# Patient Record
Sex: Male | Born: 1950
Health system: Southern US, Community
[De-identification: ages and names within clinical notes are randomized; demographics above are authoritative.]

## PROBLEM LIST (undated history)

## (undated) DIAGNOSIS — M1990 Unspecified osteoarthritis, unspecified site: Secondary | ICD-10-CM

## (undated) DIAGNOSIS — Z973 Presence of spectacles and contact lenses: Secondary | ICD-10-CM

## (undated) DIAGNOSIS — I10 Essential (primary) hypertension: Secondary | ICD-10-CM

## (undated) DIAGNOSIS — G40109 Localization-related (focal) (partial) symptomatic epilepsy and epileptic syndromes with simple partial seizures, not intractable, without status epilepticus: Secondary | ICD-10-CM

## (undated) DIAGNOSIS — M5126 Other intervertebral disc displacement, lumbar region: Secondary | ICD-10-CM

## (undated) DIAGNOSIS — H532 Diplopia: Secondary | ICD-10-CM

## (undated) DIAGNOSIS — Z889 Allergy status to unspecified drugs, medicaments and biological substances status: Secondary | ICD-10-CM

## (undated) DIAGNOSIS — F988 Other specified behavioral and emotional disorders with onset usually occurring in childhood and adolescence: Secondary | ICD-10-CM

## (undated) DIAGNOSIS — K219 Gastro-esophageal reflux disease without esophagitis: Secondary | ICD-10-CM

## (undated) DIAGNOSIS — J189 Pneumonia, unspecified organism: Secondary | ICD-10-CM

## (undated) DIAGNOSIS — L508 Other urticaria: Secondary | ICD-10-CM

## (undated) DIAGNOSIS — C801 Malignant (primary) neoplasm, unspecified: Secondary | ICD-10-CM

## (undated) DIAGNOSIS — Z760 Encounter for issue of repeat prescription: Secondary | ICD-10-CM

## (undated) DIAGNOSIS — R972 Elevated prostate specific antigen [PSA]: Secondary | ICD-10-CM

## (undated) DIAGNOSIS — G43909 Migraine, unspecified, not intractable, without status migrainosus: Secondary | ICD-10-CM

## (undated) HISTORY — DX: Essential (primary) hypertension: I10

## (undated) HISTORY — DX: Encounter for issue of repeat prescription: Z76.0

## (undated) HISTORY — PX: WISDOM TOOTH EXTRACTION: SHX21

## (undated) HISTORY — PX: PROSTATE BIOPSY: SHX241

## (undated) HISTORY — PX: COLONOSCOPY: SHX174

## (undated) HISTORY — PX: BACK SURGERY: SHX140

## (undated) HISTORY — PX: NASAL SINUS SURGERY: SHX719

## (undated) HISTORY — DX: Other urticaria: L50.8

## (undated) HISTORY — PX: TONSILLECTOMY: SUR1361

## (undated) HISTORY — DX: Migraine, unspecified, not intractable, without status migrainosus: G43.909

## (undated) HISTORY — DX: Diplopia: H53.2

---

## 2011-12-24 ENCOUNTER — Other Ambulatory Visit: Payer: Self-pay | Admitting: Family Medicine

## 2011-12-24 DIAGNOSIS — E039 Hypothyroidism, unspecified: Secondary | ICD-10-CM

## 2011-12-27 ENCOUNTER — Ambulatory Visit
Admission: RE | Admit: 2011-12-27 | Discharge: 2011-12-27 | Disposition: A | Discharge status: Home / Self Care | Payer: BC Managed Care – PPO | Source: Ambulatory Visit | Attending: Family Medicine | Admitting: Family Medicine

## 2011-12-27 DIAGNOSIS — E039 Hypothyroidism, unspecified: Secondary | ICD-10-CM

## 2011-12-28 ENCOUNTER — Other Ambulatory Visit: Payer: Self-pay

## 2012-09-08 ENCOUNTER — Telehealth: Payer: Self-pay | Admitting: Neurology

## 2012-09-08 NOTE — Telephone Encounter (Signed)
Spoke with pt and scheduled an appt on 01-13-13 at 3:30 PM with Dr Hosie Poisson.

## 2012-12-12 ENCOUNTER — Encounter: Payer: Self-pay | Admitting: Neurology

## 2012-12-12 ENCOUNTER — Ambulatory Visit (INDEPENDENT_AMBULATORY_CARE_PROVIDER_SITE_OTHER): Payer: Self-pay | Admitting: Neurology

## 2012-12-12 VITALS — BP 123/81 | HR 71 | Ht 68.0 in | Wt 163.0 lb

## 2012-12-12 DIAGNOSIS — G40909 Epilepsy, unspecified, not intractable, without status epilepticus: Secondary | ICD-10-CM | POA: Insufficient documentation

## 2012-12-12 DIAGNOSIS — R569 Unspecified convulsions: Secondary | ICD-10-CM

## 2012-12-12 NOTE — Patient Instructions (Signed)
Overall you are doing fairly well but I do want to suggest a few things today:   Remember to drink plenty of fluid, eat healthy meals and do not skip any meals. Try to eat protein with a every meal and eat a healthy snack such as fruit or nuts in between meals. Try to keep a regular sleep-wake schedule and try to exercise daily, particularly in the form of walking, 20-30 minutes a day, if you can.   As far as your medications are concerned, we will continue you on tegretol 200mg  bid.   We will follow up with Dr Clarene Duke in regards to your lab work.   I would like to see you back in 12 months, sooner if we need to. Please call us with any interim questions, concerns, problems, updates or refill requests.   Please also call us for any test results so we can go over those with you on the phone.  My clinical assistant and will answer any of your questions and relay your messages to me and also relay most of my messages to you.   Our phone number is [phone]. We also have an after hours call service for urgent matters and there is a physician on-call for urgent questions. For any emergencies you know to call 911 or go to the nearest emergency room

## 2012-12-12 NOTE — Progress Notes (Addendum)
Provider:  Dr Hosie Poisson Referring Provider: No ref. provider found Primary Care Physician:  No primary provider on file.  CC: seizures  HPI:  Ethan Santana is a 62 y.o. male here as a follow up visit for his seizure disorder  No acute change since last visit. Continues to take Tegretol 200 mg twice a day. He recently had lab work including Tegretol level and sodium done by his primary care physician. He reports everything is normal, though he doesn't have copies of the lab reports. Denies any seizure activity in over 25 years. He notes his seizures consisted of hearing music. Tolerating Tegretol well, no gait ataxia of visual issues no dizziness no vertigo. Reports his sodium levels have been normal on all checks. Reports his last bone density scan was around 2-3 years ago was normal. Continues to take calcium and vitamin D.  Otherwise no acute issues, reports health is good. Remains active. Is currently driving.   Per Dr Ethan Santana prior note from 02/2012: 62y/o gentleman with history of seizures since childhood. He notes a strange sound or music going through his head, also have a sensation of something smooth. Lasted anywhere from few minutes to . Evaluated at [facility], had abnormal EEG, started on Dilantin, symptoms resolved after 3 years. Repeat EEG normal and they took off Dilantin   Review of Systems:  Out of a complete 14 system review, the patient complains of only the following symptoms, and all other reviewed systems are negative. Positive for remote seizure history and itching  History   Social History  . Marital Status: Married    Spouse Name: N/A    Number of Children: N/A  . Years of Education: N/A   Occupational History  . Not on file.   Social History Main Topics  . Smoking status: Not on file  . Smokeless tobacco: Not on file  . Alcohol Use: Not on file  . Drug Use: Not on file  . Sexually Active: Not on file   Other Topics Concern  . Not on file   Social  History Narrative  . No narrative on file    No family history on file.  No past medical history on file.  No past surgical history on file.  No current outpatient prescriptions on file.   No current facility-administered medications for this visit.    Allergies as of 12/12/2012  . (Not on File)    Vitals: There were no vitals taken for this visit. Last Weight:  Wt Readings from Last 1 Encounters:  No data found for Wt   Last Height:   Ht Readings from Last 1 Encounters:  No data found for Ht     Physical exam: Exam: Gen: NAD, conversant Eyes: anicteric sclerae, moist conjunctivae HENT: Atraumati Lungs: CTA, no wheezing, rales, rhonic                          CV: RRR, no MRG Abdomen: Soft, non-tender;  Extremities: No peripheral edema  Skin: Normal temperature, no rash,  Psych: Appropriate affect, pleasant  Neuro: MS: AA&Ox3, appropriately interactive, normal affect   Speech: fluent w/o paraphasic error  Memory: good recent and remote recall  CN: PERRL, EOMI few beats horizontal nystagmus bilaterally, no ptosis, sensation intact to LT V1-V3 bilat, face symmetric, no weakness, hearing grossly intact, palate elevates symmetrically, shoulder shrug 5/5 bilat,  tongue protrudes midline, no fasiculations noted.  Motor: normal bulk and tone Strength: 5/5  In all extremities  Coord: rapid alternating and point-to-point (FNF, HTS) movements intact.  Reflexes: symmetrical, bilat downgoing toes  Sens: LT intact in all extremities  Gait: posture, stance, stride and arm-swing normal. Able to tandem though with mild difficulty.    Assessment:  After physical and neurologic examination, review of laboratory studies, imaging, neurophysiology testing and pre-existing records, assessment will be reviewed on the problem list.  Plan:  Treatment plan and additional workup will be reviewed under Problem List.  Mr. Ethan Santana is a pleasant 62 year old gentleman with a  history of seizure disorder which is currently well controlled on a regimen of Tegretol 200 mg twice a day. He reports his last seizure was over 25 years ago. He has been tried off of medication and does have her breakthrough seizures. Physical exam is clinically stable.  1) seizure disorder -Continue on Tegretol 200 mg twice a day. -Will followup with primary care physician for lab work -Will plan for bone density scan repeated at next visit. Patient to continue on calcium and vitamin D supplement -Patient to followup in one year  Total of 30 minutes was spent with this patient. Over half the time was spent in direct face-to-face consultation. We discussed patient's concerns about his medication, such for breakthrough seizures and the necessity for a bone density scan. All questions were fully answered.   I have read the note, and I agree with the clinical assessment and plan.  Ethan Santana

## 2013-01-13 ENCOUNTER — Ambulatory Visit: Payer: Self-pay | Admitting: Neurology

## 2013-02-04 ENCOUNTER — Other Ambulatory Visit: Payer: Self-pay

## 2013-02-04 MED ORDER — CARBAMAZEPINE 200 MG PO TABS: 200.0000 mg | ORAL_TABLET | Freq: Two times a day (BID) | ORAL | Status: DC

## 2013-12-11 ENCOUNTER — Ambulatory Visit: Payer: Self-pay | Admitting: Neurology

## 2013-12-29 ENCOUNTER — Other Ambulatory Visit: Payer: Self-pay

## 2013-12-29 MED ORDER — CARBAMAZEPINE 200 MG PO TABS: 200.0000 mg | ORAL_TABLET | Freq: Two times a day (BID) | ORAL | Status: DC

## 2014-01-06 ENCOUNTER — Ambulatory Visit (INDEPENDENT_AMBULATORY_CARE_PROVIDER_SITE_OTHER): Payer: BC Managed Care – PPO | Admitting: Neurology

## 2014-01-06 ENCOUNTER — Encounter: Payer: Self-pay | Admitting: Neurology

## 2014-01-06 VITALS — BP 132/84 | HR 70 | Ht 68.0 in | Wt 160.0 lb

## 2014-01-06 DIAGNOSIS — R569 Unspecified convulsions: Secondary | ICD-10-CM

## 2014-01-06 MED ORDER — CARBAMAZEPINE 200 MG PO TABS: ORAL_TABLET | ORAL | Status: DC

## 2014-01-06 NOTE — Patient Instructions (Signed)
Overall you are doing fairly well but I do want to suggest a few things today:   Remember to drink plenty of fluid, eat healthy meals and do not skip any meals. Try to eat protein with a every meal and eat a healthy snack such as fruit or nuts in between meals. Try to keep a regular sleep-wake schedule and try to exercise daily, particularly in the form of walking, 20-30 minutes a day, if you can.   As far as your medications are concerned, I would like to suggest you change your tegretol to 1/2 tablet in the morning and 1 whole tablet in the evening  As far as diagnostic testing:  1)Please have some blood work checked when you follow up with Dr Rex Kras  Please follow up in 12 months with Dr Brett Fairy. Please call us with any interim questions, concerns, problems, updates or refill requests.   My clinical assistant and will answer any of your questions and relay your messages to me and also relay most of my messages to you.   Our phone number is [phone]. We also have an after hours call service for urgent matters and there is a physician on-call for urgent questions. For any emergencies you know to call 911 or go to the nearest emergency room

## 2014-01-06 NOTE — Progress Notes (Signed)
Provider:  Dr Ethan Santana Referring Provider: Hulan Fess, MD Primary Care Physician:  Ethan Pac, MD  CC: seizures  HPI:  Ethan Santana is a 64 y.o. male here as a follow up visit for his seizure disorder  No acute change since last visit on 12/2012. Continues to take Tegretol 200 mg twice a day. He notes overall he is tolerating well, notes some recent intermittent episodes of double vision. Notes this is only when he is looking upward. Comes and goes, can be exacerbated by cold weather. No seizure activity, no aura or sensory/motor changes. Takes vitamin D on a regular basis. He is scheduled to follow up with his PCP next month for routine blood work. Notes some worsening of his actinic purpura, he is followed by dermatology for this.   Overall reports he is in good health. Remains active, walks on a regular basis.   Per Dr Ethan Santana prior note from 02/2012: 63y/o gentleman with history of seizures since childhood. He notes a strange sound or music going through his head, also have a sensation of something smooth. Lasted anywhere from few minutes to 1minutes. Evaluated at [facility], had abnormal EEG, started on Dilantin, symptoms resolved after 3 years. Repeat EEG normal and they took off Dilantin   Review of Systems:  Out of a complete 14 system review, the patient complains of only the following symptoms, and all other reviewed systems are negative. Positive for remote seizure history and itching  History   Social History  . Marital Status: Married    Spouse Name: Ethan Santana     Number of Children: 2  . Years of Education: 12+   Occupational History  . Self empolyed     Social History Main Topics  . Smoking status: Never Smoker   . Smokeless tobacco: Never Used  . Alcohol Use: No  . Drug Use: No  . Sexual Activity: Not on file   Other Topics Concern  . Not on file   Social History Narrative   Patient lives at home wife Ethan Santana    Have have 2 children.    Patient is right  handed.    Patient has his masters   Patient works at home.     Family History  Problem Relation Age of Onset  . Stroke Mother   . Bone cancer Father   . Cancer Mother     Past Medical History  Diagnosis Date  . HBP (high blood pressure)   . Seizure   . Migraine   . Chronic urticaria     Past Surgical History  Procedure Laterality Date  . Tonsillectomy      Current Outpatient Prescriptions  Medication Sig Dispense Refill  . amphetamine-dextroamphetamine (ADDERALL) 20 MG tablet       . Calcium Carb-Cholecalciferol (CALCIUM 1000 + D PO) Take 1,000 mg by mouth daily.       . carbamazepine (TEGRETOL) 200 MG tablet Take 1 tablet (200 mg total) by mouth 2 (two) times daily.  60 tablet  0  . Cholecalciferol (VITAMIN D) 2000 UNITS CAPS Take 2,000 Units by mouth daily.      Marland Kitchen CLOBETASOL PROP CREA-COAL TAR EX Apply topically.      . famotidine (PEPCID) 20 MG tablet Take 20 mg by mouth daily.      . fluticasone (FLONASE) 50 MCG/ACT nasal spray       . Loratadine (CLARITIN) 10 MG CAPS Take 10 mg by mouth 3 (three) times daily.       Marland Kitchen  nadolol (CORGARD) 20 MG tablet Take by mouth daily.      Marland Kitchen OMEPRAZOLE PO Take by mouth.      . psyllium (REGULOID) 0.52 G capsule Take 0.52 g by mouth 4 (four) times daily.      Marland Kitchen VIAGRA 100 MG tablet        No current facility-administered medications for this visit.    Allergies as of 01/06/2014  . (No Known Allergies)    Vitals: BP 132/84  Pulse 70  Ht 5\' 8"  (1.727 m)  Wt 160 lb (72.576 kg)  BMI 24.33 kg/m2 Last Weight:  Wt Readings from Last 1 Encounters:  01/06/14 160 lb (72.576 kg)   Last Height:   Ht Readings from Last 1 Encounters:  01/06/14 5\' 8"  (1.727 m)     Physical exam: Exam: Gen: NAD, conversant Eyes: anicteric sclerae, moist conjunctivae HENT: Atraumati Lungs: CTA, no wheezing, rales, rhonic                          CV: RRR, no MRG Abdomen: Soft, non-tender;  Extremities: No peripheral edema  Skin: Normal  temperature, no rash,  Psych: Appropriate affect, pleasant  Neuro: MS: AA&Ox3, appropriately interactive, normal affect   Speech: fluent w/o paraphasic error  Memory: good recent and remote recall  CN: PERRL, EOMI few beats horizontal nystagmus bilaterally, medial deviation of OS with up gaze, no ptosis, sensation intact to LT V1-V3 bilat, face symmetric, no weakness, hearing grossly intact, palate elevates symmetrically, shoulder shrug 5/5 bilat,  tongue protrudes midline, no fasiculations noted.  Motor: normal bulk and tone Strength: 5/5  In all extremities  Coord: rapid alternating and point-to-point (FNF, HTS) movements intact.  Reflexes: symmetrical, bilat downgoing toes  Sens: LT intact in all extremities  Gait: posture, stance, stride and arm-swing normal. Able to tandem though with mild difficulty.    Assessment:  After physical and neurologic examination, review of laboratory studies, imaging, neurophysiology testing and pre-existing records, assessment will be reviewed on the problem list.  Plan:  Treatment plan and additional workup will be reviewed under Problem List.  Ethan Santana is a pleasant 63 year old gentleman with a history of seizure disorder which is currently well controlled on a regimen of Tegretol 200 mg twice a day. He reports his last seizure was over 25 years ago. He does note some mild blurry vision and diplopia with up gaze. Question if this can be related to tegretol usage. He will have level checked with PCP next month. Will try to taper down dosage to 100mg  in the morning and 200mg  in the evening to see if any improvement. Continue on daily calcium and vitamin D.  He has been tried off of medication and does have her breakthrough seizures. Physical exam is clinically stable.  1) seizure disorder -Continue on Tegretol with dose change to 100mg  in the morning and 200mg  in the evening. -Will followup with primary care physician for lab work -Patient to  continue on calcium and vitamin D supplement -Patient to followup in one year

## 2015-01-11 ENCOUNTER — Encounter: Payer: Self-pay | Admitting: Neurology

## 2015-01-11 ENCOUNTER — Ambulatory Visit (INDEPENDENT_AMBULATORY_CARE_PROVIDER_SITE_OTHER): Payer: 59 | Admitting: Neurology

## 2015-01-11 VITALS — BP 136/89 | HR 66 | Resp 16 | Ht 68.0 in | Wt 162.5 lb

## 2015-01-11 DIAGNOSIS — H532 Diplopia: Secondary | ICD-10-CM | POA: Diagnosis not present

## 2015-01-11 DIAGNOSIS — G4089 Other seizures: Secondary | ICD-10-CM

## 2015-01-11 DIAGNOSIS — G40909 Epilepsy, unspecified, not intractable, without status epilepticus: Secondary | ICD-10-CM

## 2015-01-11 DIAGNOSIS — Z760 Encounter for issue of repeat prescription: Secondary | ICD-10-CM

## 2015-01-11 DIAGNOSIS — R0683 Snoring: Secondary | ICD-10-CM | POA: Diagnosis not present

## 2015-01-11 HISTORY — DX: Encounter for issue of repeat prescription: Z76.0

## 2015-01-11 HISTORY — DX: Diplopia: H53.2

## 2015-01-11 MED ORDER — CARBAMAZEPINE 200 MG PO TABS: ORAL_TABLET | ORAL | Status: DC

## 2015-01-11 NOTE — Progress Notes (Signed)
[facility]   Provider:  Larey Seat, M D  Referring Provider: Hulan Fess, MD Primary Care Physician:  Gennette Pac, MD  Chief Complaint  Patient presents with  . Follow-up    sz    HPI:  Ethan Santana is a 64 y.o. male , seen here as a referral/ revisit  from Ethan Santana for a seizure disorder, well controlled on Tegretol.   Last seizure over 2 decades ago. Treated by Ethan Santana until 2014,  he was followed by Ethan Santana last year and now assigned to me. Ethan Santana reports that even during his college years he was well aware of spells and finally suffered a seizure. He was initially on Dilantin, weaning the medication caused seizures to resurface. He could describe sensory abnormalities. He has experienced " sense of doom " , and no seizure activity visible on the outside, no tonic clonic activity. Until 2 years ago,  he was on Tegretol by that time, he would begin to have occasional diplopia.  An EEG confirmed his seizure diagnosis, after the first one was "normal ".   The patient has no restrictions on activities of daily living, operation of machinery's, driving, [place] in altitudes etc. Again he has never suffered a tonic-clonic seizure.  Social history: The patient is married with grown children, is an e book Chief Strategy Officer, Financial risk analyst.  The patient has 2 master's degree one in Careers information officer and one in education - pedagogic.  Review of Systems: Out of a complete 14 system review, the patient complains of only the following symptoms, and all other reviewed systems are negative.   depression score 2 Snoring, not apnea/  Social History   Social History  . Marital Status: Married    Spouse Name: [name]   . Number of Children: 2  . Years of Education: 12+   Occupational History  . Self empolyed     Social History Main Topics  . Smoking status: Never Smoker   . Smokeless tobacco: Never Used  . Alcohol Use: No  . Drug Use: No  .  Sexual Activity: Not on file   Other Topics Concern  . Not on file   Social History Narrative   Patient lives at home wife [name]    Have have 2 children.    Patient is right handed.    Patient has his masters   Patient works at home.     Family History  Problem Relation Age of Onset  . Stroke Mother   . Bone cancer Father   . Cancer Mother     Past Medical History  Diagnosis Date  . HBP (high blood pressure)   . Seizure   . Migraine   . Chronic urticaria   . Refill clinic medication management patient 01/11/2015  . Diplopia 01/11/2015    Past Surgical History  Procedure Laterality Date  . Tonsillectomy      Current Outpatient Prescriptions  Medication Sig Dispense Refill  . amphetamine-dextroamphetamine (ADDERALL) 20 MG tablet     . Calcium Carb-Cholecalciferol (CALCIUM 1000 + D PO) Take 1,000 mg by mouth daily.     . carbamazepine (TEGRETOL) 200 MG tablet Take 1/2 tablet in the morning and 1 whole tablet in the evening 180 tablet 3  . Cholecalciferol (VITAMIN D) 2000 UNITS CAPS Take 2,000 Units by mouth daily.    Marland Kitchen CLOBETASOL PROP CREA-COAL TAR EX Apply topically.    . famotidine (PEPCID) 20 MG tablet Take 20 mg by mouth  daily.    . fluticasone (FLONASE) 50 MCG/ACT nasal spray     . Loratadine (CLARITIN) 10 MG CAPS Take 10 mg by mouth 3 (three) times daily.     . nadolol (CORGARD) 20 MG tablet Take by mouth daily.    Marland Kitchen OMEPRAZOLE PO Take by mouth.    . psyllium (REGULOID) 0.52 G capsule Take 0.52 g by mouth 4 (four) times daily.    Marland Kitchen VIAGRA 100 MG tablet      No current facility-administered medications for this visit.    Allergies as of 01/11/2015  . (No Known Allergies)    Vitals: BP 136/89 mmHg  Pulse 66  Resp 16  Ht 5\' 8"  (1.727 m)  Wt 162 lb 8 oz (73.71 kg)  BMI 24.71 kg/m2 Last Weight:  Wt Readings from Last 1 Encounters:  01/11/15 162 lb 8 oz (73.71 kg)   VPC:HEKB mass index is 24.71 kg/(m^2).     Last Height:   Ht Readings from Last 1  Encounters:  01/11/15 5\' 8"  (1.727 m)    Physical exam:  General: The patient is awake, alert and appears not in acute distress. The patient is well groomed. Head: Normocephalic, atraumatic. Neck is supple. Mallampati3,  neck circumference:15. Nasal airflow unrestricted , Cardiovascular:  Regular rate and rhythm , without  murmurs or carotid bruit, and without distended neck veins. Respiratory: Lungs are clear to auscultation. Skin:  Without evidence of edema, or rash Trunk:  The patient's posture is erect.  Neurologic exam : The patient is awake and alert, oriented to place and time.   Memory subjective  described as intact.  Attention span & concentration ability appears normal.  Speech is fluent,  Without  dysarthria, dysphonia or aphasia.  Mood and affect are appropriate.  Cranial nerves: Pupils are equal and briskly reactive to light.   Extraocular movements  in vertical and horizontal planes intact and without nystagmus. Visual fields by finger perimetry are intact. Ethan Santana reports skewed diplopia, worse when he is fatigued and worse when the weather is cold. Worse when looking up.  Hearing to finger rub intact.   Facial sensation intact to fine touch.  Facial motor strength is symmetric and tongue and uvula move midline. Shoulder shrug was symmetrical.   Motor exam:  Normal tone, muscle bulk and symmetric strength in all extremities.  Sensory:  Fine touch, pinprick and vibration were tested in all extremities. Proprioception tested in the upper extremities was normal.  Coordination: Rapid alternating movements in the fingers/hands was normal.  Finger-to-nose maneuver normal without evidence of ataxia, dysmetria or tremor.  Gait and station: Patient walks without assistive device and is able unassisted to climb up to the exam table. Strength within normal limits.  Stance is stable and normal.  Toe and hell stand were tested .Tandem gait is unfragmented. Turns with 3   Steps. Romberg testing is negative.  Deep tendon reflexes: in the  upper and lower extremities are symmetric and intact. Plantar reflex downgoing.  The patient was advised of the nature of the diagnosed sleep disorder,  the treatment options and risks for general a health and wellness arising from not treating the condition.  I spent more than 25 minutes of face to face time with the patient.   Greater than 50% of time was spent in counseling and coordination of care. We have discussed the diagnosis and differential and I answered the patient's questions.    Ethan Santana saw the patient on 10-10 -2013 at the  time of 64 year old gentleman with history of seizures since childhood. He will note a strange sound or music going through his had and sometimes have a sensation of being touched by some things will. Can last anywhere from a few minutes to 30 minutes. Had abnormal EEGs in the past, started on Dilantin symptoms resolved after 3 years after weaning off Dilantin the patient had the same activity. Repeat EEGs were normal but the patient still experienced the same spells he was changed to Tegretol which she has been well controlled ever since. Assessment:  After physical and neurologic examination, review of laboratory studies,  Personal review of imaging studies, reports of other /same  Imaging studies, results of polysomnography/ neurophysiology testing and pre-existing records as far as provided in visit., my assessment is   1)  well-controlled seizure disorder currently on 200 mg of Tegretol in the evening 100 mg in the morning.   Encouraged continuation of calcium and vitamin D supplements.   Encouraged hydration. Primary care physician usually provides labwork once a year.  He has to be followed for hyponatremia and leukopenia in relation to long-term Tegretol use.    Asencion Partridge Ines Warf MD  01/11/2015   CC: Hulan Fess, [address] [place], [place] [address]

## 2015-01-11 NOTE — Patient Instructions (Signed)
Carbamazepine tablets What is this medicine? CARBAMAZEPINE (kar ba MAZ e peen) is used to control seizures caused by certain types of epilepsy. This medicine is also used to treat nerve related pain. It is not for common aches and pains. This medicine may be used for other purposes; ask your health care provider or pharmacist if you have questions. COMMON BRAND NAME(S): Epitol, Tegretol What should I tell my health care provider before I take this medicine? They need to know if you have any of these conditions: -Asian ancestry -bone marrow disease -glaucoma -heart disease or irregular heartbeat -kidney disease -liver disease -low blood counts, like low white cell, platelet, or red cell counts -porphyria -psychotic disorders -suicidal thoughts, plans, or attempt; a previous suicide attempt by you or a family member -an unusual or allergic reaction to carbamazepine, tricyclic antidepressants, phenytoin, phenobarbital or other medicines, foods, dyes, or preservatives -pregnant or trying to get pregnant -breast-feeding How should I use this medicine? Take this medicine by mouth with a glass of water. Follow the directions on the prescription label. Take this medicine with food. Take your doses at regular intervals. Do not take your medicine more often than directed. Do not stop taking this medicine except on the advice of your doctor or health care professional. A special MedGuide will be given to you by the pharmacist with each prescription and refill. Be sure to read this information carefully each time. Talk to your pediatrician regarding the use of this medicine in children. Special care may be needed. Overdosage: If you think you have taken too much of this medicine contact a poison control center or emergency room at once. NOTE: This medicine is only for you. Do not share this medicine with others. What if I miss a dose? If you miss a dose, take it as soon as you can. If it is almost  time for your next dose, take only that dose. Do not take double or extra doses. What may interact with this medicine? Do not take this medicine with any of the following medications: -delavirdine -MAOIs like Carbex, Eldepryl, Marplan, Nardil, and Parnate -nefazodone -oxcarbazepine This medicine may also interact with the following medications: -acetaminophen -acetazolamide -barbiturate medicines for inducing sleep or treating seizures, like phenobarbital -certain antibiotics like clarithromycin, erythromycin or troleandomycin -cimetidine -cyclosporine -danazol -dicumarol -doxycycline -male hormones, including estrogens and birth control pills -grapefruit juice -isoniazid, INH -levothyroxine and other thyroid hormones -lithium and other medicines to treat mood problems or psychotic disturbances -loratadine -medicines for angina or high blood pressure -medicines for cancer -medicines for depression or anxiety -medicines for sleep -medicines to treat fungal infections, like fluconazole, itraconazole or ketoconazole -medicines used to treat HIV infection or AIDS -methadone -niacinamide -praziquantel -propoxyphene -rifampin or rifabutin -seizure or epilepsy medicine -steroid medicines such as prednisone or cortisone -theophylline -tramadol -warfarin This list may not describe all possible interactions. Give your health care provider a list of all the medicines, herbs, non-prescription drugs, or dietary supplements you use. Also tell them if you smoke, drink alcohol, or use illegal drugs. Some items may interact with your medicine. What should I watch for while using this medicine? Visit your doctor or health care professional for a regular check on your progress. Do not change brands or dosage forms of this medicine without discussing the change with your doctor or health care professional. If you are taking this medicine for epilepsy (seizures) do not stop taking it suddenly.  This increases the risk of seizures. Wear a Probation officer  or necklace. Carry an identification card with information about your condition, medications, and doctor or health care professional. Dennis Bast may get drowsy, dizzy, or have blurred vision. Do not drive, use machinery, or do anything that needs mental alertness until you know how this medicine affects you. To reduce dizzy or fainting spells, do not sit or stand up quickly, especially if you are an older patient. Alcohol can increase drowsiness and dizziness. Avoid alcoholic drinks. Birth control pills may not work properly while you are taking this medicine. Talk to your doctor about using an extra method of birth control. This medicine can make you more sensitive to the sun. Keep out of the sun. If you cannot avoid being in the sun, wear protective clothing and use sunscreen. Do not use sun lamps or tanning beds/booths. The use of this medicine may increase the chance of suicidal thoughts or actions. Pay special attention to how you are responding while on this medicine. Any worsening of mood, or thoughts of suicide or dying should be reported to your health care professional right away. Women who become pregnant while using this medicine may enroll in the [place] Pregnancy Registry by calling [phone]. This registry collects information about the safety of antiepileptic drug use during pregnancy. What side effects may I notice from receiving this medicine? Side effects that you should report to your doctor or health care professional as soon as possible: -allergic reactions like skin rash, itching or hives, swelling of the face, lips, or tongue -breathing problems -changes in vision -confusion -dark urine -fast or irregular heartbeat -fever or chills, sore throat -mouth ulcers -pain or difficulty passing urine -redness, blistering, peeling or loosening of the skin, including inside the mouth -ringing in  the ears -seizures -stomach pain -swollen joints or muscle/joint aches and pains -unusual bleeding or bruising -unusually weak or tired -vomiting -worsening of mood, thoughts or actions of suicide or dying -yellowing of the eyes or skin Side effects that usually do not require medical attention (report to your doctor or health care professional if they continue or are bothersome): -clumsiness or unsteadiness -diarrhea or constipation -headache -increased sweating -nausea This list may not describe all possible side effects. Call your doctor for medical advice about side effects. You may report side effects to FDA at 1-800-FDA-1088. Where should I keep my medicine? Keep out of reach of children. Store at room temperature below 30 degrees C (86 degrees F). Keep container tightly closed. Protect from moisture. Throw away any unused medicine after the expiration date. NOTE: This sheet is a summary. It may not cover all possible information. If you have questions about this medicine, talk to your doctor, pharmacist, or health care provider.  2015, Elsevier/Gold Standard. (2012-07-01 15:31:28)

## 2015-08-23 ENCOUNTER — Encounter: Payer: Self-pay | Admitting: Neurology

## 2016-01-12 ENCOUNTER — Ambulatory Visit: Payer: 59 | Admitting: Neurology

## 2016-02-06 ENCOUNTER — Other Ambulatory Visit: Payer: Self-pay | Admitting: Neurology

## 2016-02-06 DIAGNOSIS — R0683 Snoring: Secondary | ICD-10-CM

## 2016-02-06 DIAGNOSIS — Z760 Encounter for issue of repeat prescription: Secondary | ICD-10-CM

## 2016-02-06 DIAGNOSIS — G40909 Epilepsy, unspecified, not intractable, without status epilepticus: Secondary | ICD-10-CM

## 2016-02-06 DIAGNOSIS — H532 Diplopia: Secondary | ICD-10-CM

## 2016-02-07 ENCOUNTER — Ambulatory Visit (INDEPENDENT_AMBULATORY_CARE_PROVIDER_SITE_OTHER): Payer: BLUE CROSS/BLUE SHIELD | Admitting: Neurology

## 2016-02-07 ENCOUNTER — Encounter: Payer: Self-pay | Admitting: Neurology

## 2016-02-07 DIAGNOSIS — R0683 Snoring: Secondary | ICD-10-CM | POA: Insufficient documentation

## 2016-02-07 DIAGNOSIS — G40909 Epilepsy, unspecified, not intractable, without status epilepticus: Secondary | ICD-10-CM

## 2016-02-07 DIAGNOSIS — H532 Diplopia: Secondary | ICD-10-CM

## 2016-02-07 DIAGNOSIS — Z760 Encounter for issue of repeat prescription: Secondary | ICD-10-CM

## 2016-02-07 MED ORDER — CARBAMAZEPINE 200 MG PO TABS: ORAL_TABLET | ORAL | 11 refills | Status: DC

## 2016-02-07 NOTE — Progress Notes (Signed)
[facility]   Provider:  Larey Seat, M D  Referring Provider: Hulan Fess, MD Primary Care Physician:  Gennette Pac, MD  Chief Complaint  Patient presents with  . Follow-up    follow up seizures, no seizure activity    HPI:  Ethan Santana is a 65 y.o. male , seen here as a referral/ revisit  from Dr. Rex Kras for a seizure disorder, well controlled on Tegretol.   Last seizure over 2 decades ago. Treated by Dr. Erling Cruz until 2014,  he was followed by Dr. Janann Colonel last year and now assigned to me since 2016 . Ethan Santana reports that even during his college years he was well aware of spells and finally suffered a seizure. He was initially on Dilantin, weaning the medication caused seizures to resurface. He could describe sensory abnormalities. He has experienced " sense of doom " , and no seizure activity visible on the outside, no tonic clonic activity. Until 2 years ago,  he was on Tegretol by that time, he would begin to have occasional diplopia.  An EEG confirmed his seizure diagnosis, after the first one was "normal ".   The patient has no restrictions on activities of daily living, operation of machinery's, driving, being in higher altitudes etc. Again he has never suffered a tonic-clonic seizure. Social history: The patient is married with grown children, is an e book Chief Strategy Officer, Financial risk analyst. The patient has 2 master's degree one in Careers information officer and one in education - pedagogic in science .  Interval history from 02/07/2016, the pleasure of seeing Ethan Santana today in a yearly revisit. He has been doing very well his last seizure is over 23 years ago. He has maintained on the same medication with good success but with a slight side effect progression including balance impairment when in a dark room. His optic control his balance is fine. Has occasionally had to scoot diplopia which also could be attributable to Tegretol (especially if the  patient has a "lazy eye").  He is in good health and with good humour. This years DEXA scan was normal.    Review of Systems: Out of a complete 14 system review, the patient complains of only the following symptoms, and all other reviewed systems are negative.   Geriatric depression score;  2/ 15  Snoring, not apnea/ no insomnia.   Social History   Social History  . Marital status: Married    Spouse name: [name]   . Number of children: 2  . Years of education: 12+   Occupational History  . Self empolyed     Social History Main Topics  . Smoking status: Never Smoker  . Smokeless tobacco: Never Used  . Alcohol use No  . Drug use: No  . Sexual activity: Not on file   Other Topics Concern  . Not on file   Social History Narrative   Patient lives at home wife [name]    Have have 2 children.    Patient is right handed.    Patient has his masters   Patient works at home.     Family History  Problem Relation Age of Onset  . Stroke Mother   . Bone cancer Father   . Cancer Mother     Past Medical History:  Diagnosis Date  . Chronic urticaria   . Diplopia 01/11/2015  . HBP (high blood pressure)   . Migraine   . Refill clinic medication management patient 01/11/2015  .  Seizure [facility])     Past Surgical History:  Procedure Laterality Date  . TONSILLECTOMY      Current Outpatient Prescriptions  Medication Sig Dispense Refill  . amphetamine-dextroamphetamine (ADDERALL) 20 MG tablet     . Calcium Carb-Cholecalciferol (CALCIUM 1000 + D PO) Take 1,000 mg by mouth daily.     . carbamazepine (TEGRETOL) 200 MG tablet TAKE 1/2 TABLET IN THE MORNING AND 1 TABLET IN THE EVENING. 45 tablet 0  . Cholecalciferol (VITAMIN D) 2000 UNITS CAPS Take 2,000 Units by mouth daily.    Marland Kitchen CLOBETASOL PROP CREA-COAL TAR EX Apply topically.    . famotidine (PEPCID) 20 MG tablet Take 20 mg by mouth daily.    . fluticasone (FLONASE) 50 MCG/ACT nasal spray     . Loratadine (CLARITIN) 10 MG CAPS  Take 10 mg by mouth 3 (three) times daily.     . nadolol (CORGARD) 20 MG tablet Take by mouth daily.    Marland Kitchen OMEPRAZOLE PO Take by mouth.    . psyllium (REGULOID) 0.52 G capsule Take 0.52 g by mouth 4 (four) times daily.    Marland Kitchen VIAGRA 100 MG tablet      No current facility-administered medications for this visit.     Allergies as of 02/07/2016  . (No Known Allergies)    Vitals: BP 110/86   Pulse 76   Resp 20   Ht 5\' 8"  (1.727 m)   Wt 161 lb (73 kg)   BMI 24.48 kg/m  Last Weight:  Wt Readings from Last 1 Encounters:  02/07/16 161 lb (73 kg)   [id number] mass index is 24.48 kg/m.     Last Height:   Ht Readings from Last 1 Encounters:  02/07/16 5\' 8"  (1.727 m)    Physical exam:  General: The patient is awake, alert and appears not in acute distress. The patient is well groomed. Head: Normocephalic, atraumatic. Neck is supple. Cardiovascular:  Regular rate and rhythm , without  murmurs or carotid bruit, and without distended neck veins. Respiratory: Lungs are clear to auscultation. Skin:  Without evidence of edema, or rash Trunk: The patient's posture is erect.  Neurologic exam : The patient is awake and alert, oriented to place and time.   Memory subjective  described as intact.  Attention span & concentration ability appears normal.  Speech is fluent,  Without  dysarthria, dysphonia or aphasia.  Mood and affect are appropriate.  Cranial nerves: Pupils are equal and briskly reactive to light.   Extraocular movements  in vertical and horizontal planes intact and without nystagmus. Visual fields by finger perimetry are intact. Ethan Santana reports skewed diplopia, worse when he is fatigued and worse when the weather is cold. Worse when looking up.  Hearing to finger rub intact. Facial motor strength is symmetric and tongue and uvula move midline. Shoulder shrug was symmetrical.   Motor exam:  Normal tone, muscle bulk and symmetric strength in all extremities. Coordination:  Finger-to-nose maneuver normal without evidence of ataxia, dysmetria or tremor. No handwriting changes, no clumsiness.  Gait and station: Patient walks without assistive device and is able unassisted to climb up to the exam table. Strength within normal limits.Stance is stable and normal. Turns with 3  Steps. Heel to toe is unsteady  Deep tendon reflexes: in the upper and lower extremities are symmetric and intact. Plantar reflex downgoing.  The patient was advised of the nature of the diagnosed sleep disorder,  the treatment options and risks for general a health and  wellness arising from not treating the condition.  I spent more than 20 minutes of face to face time with the patient.   Greater than 50% of time was spent in counseling and coordination of care. We have discussed the diagnosis and differential and I answered the patient's questions.    Caucasian gentleman with history of seizures since childhood. He will note a strange sound or music going through his had and sometimes have a sensation" of being touched " preceding the event . Can last anywhere from a few minutes to 30 minutes.  Had abnormal EEGs in the past, started on Dilantin symptoms resolved after 3 years after weaning off Dilantin the patient had the same activity. Repeat EEGs were normal but the patient still experienced the same spells he was changed to Tegretol, which she has been well controlled ever since. Ethan Santana has noted a decline in balance/ coordination capacity to some mild degree progressive, affecting him in a dark room. This may be attributable to Tegretol.   Assessment:  After physical and neurologic examination, review of laboratory studies,  Personal review of imaging studies, reports of other /same  Imaging studies, results of polysomnography/ neurophysiology testing and pre-existing records as far as provided in visit., my assessment is   1)  well-controlled seizure disorder currently on 200 mg of Tegretol  in the evening 100 mg in the morning.   Encouraged continuation of calcium and vitamin D supplements.   Encouraged hydration. Primary care physician usually provides labwork once a year.  He has to be followed for hyponatremia and leukopenia in relation to long-term Tegretol use.     Asencion Partridge Devona Holmes MD  02/07/2016   CC: Hulan Fess, [place] [address] [place], [place] [id number]

## 2016-02-07 NOTE — Patient Instructions (Signed)
Carbamazepine tablets What is this medicine? CARBAMAZEPINE (kar ba MAZ e peen) is used to control seizures caused by certain types of epilepsy. This medicine is also used to treat nerve related pain. It is not for common aches and pains. This medicine may be used for other purposes; ask your health care provider or pharmacist if you have questions. What should I tell my health care provider before I take this medicine? They need to know if you have any of these conditions: -Asian ancestry -bone marrow disease -glaucoma -heart disease or irregular heartbeat -kidney disease -liver disease -low blood counts, like low white cell, platelet, or red cell counts -porphyria -psychotic disorders -suicidal thoughts, plans, or attempt; a previous suicide attempt by you or a family member -an unusual or allergic reaction to carbamazepine, tricyclic antidepressants, phenytoin, phenobarbital or other medicines, foods, dyes, or preservatives -pregnant or trying to get pregnant -breast-feeding How should I use this medicine? Take this medicine by mouth with a glass of water. Follow the directions on the prescription label. Take this medicine with food. Take your doses at regular intervals. Do not take your medicine more often than directed. Do not stop taking this medicine except on the advice of your doctor or health care professional. A special MedGuide will be given to you by the pharmacist with each prescription and refill. Be sure to read this information carefully each time. Talk to your pediatrician regarding the use of this medicine in children. Special care may be needed. Overdosage: If you think you have taken too much of this medicine contact a poison control center or emergency room at once. NOTE: This medicine is only for you. Do not share this medicine with others. What if I miss a dose? If you miss a dose, take it as soon as you can. If it is almost time for your next dose, take only that  dose. Do not take double or extra doses. What may interact with this medicine? Do not take this medicine with any of the following medications: -certain medicines used to treat HIV infection or AIDS that are given in combination with cobicistat - delavirdine - MAOIs like Carbex, Eldepryl, Marplan, Nardil, and Parnate - nefazodone - oxcarbazepine This medicine may also interact with the following medications: - acetaminophen - acetazolamide - barbiturate medicines for inducing sleep or treating seizures, like phenobarbital - certain antibiotics like clarithromycin, erythromycin or troleandomycin - cimetidine - cyclosporine - danazol - dicumarol - doxycycline - male hormones, including estrogens and birth control pills - grapefruit juice - isoniazid, INH - levothyroxine and other thyroid hormones - lithium and other medicines to treat mood problems or psychotic disturbances - loratadine - medicines for angina or high blood pressure - medicines for cancer - medicines for depression or anxiety - medicines for sleep - medicines to treat fungal infections, like fluconazole, itraconazole or ketoconazole - medicines used to treat HIV infection or AIDS - methadone - niacinamide - praziquantel - propoxyphene - rifampin or rifabutin - seizure or epilepsy medicine - steroid medicines such as prednisone or cortisone - theophylline - tramadol - warfarin This list may not describe all possible interactions. Give your health care provider a list of all the medicines, herbs, non-prescription drugs, or dietary supplements you use. Also tell them if you smoke, drink alcohol, or use illegal drugs. Some items may interact with your medicine. What should I watch for while using this medicine? Visit your doctor or health care professional for a regular check on your progress. Do not  change brands or dosage forms of this medicine without discussing the change  with your doctor or health care professional. If you are taking this medicine for epilepsy (seizures) do not stop taking it suddenly. This increases the risk of seizures. Wear a Probation officer or necklace. Carry an identification card with information about your condition, medications, and doctor or health care professional. Dennis Bast may get drowsy, dizzy, or have blurred vision. Do not drive, use machinery, or do anything that needs mental alertness until you know how this medicine affects you. To reduce dizzy or fainting spells, do not sit or stand up quickly, especially if you are an older patient. Alcohol can increase drowsiness and dizziness. Avoid alcoholic drinks. Birth control pills may not work properly while you are taking this medicine. Talk to your doctor about using an extra method of birth control. This medicine can make you more sensitive to the sun. Keep out of the sun. If you cannot avoid being in the sun, wear protective clothing and use sunscreen. Do not use sun lamps or tanning beds/booths. The use of this medicine may increase the chance of suicidal thoughts or actions. Pay special attention to how you are responding while on this medicine. Any worsening of mood, or thoughts of suicide or dying should be reported to your health care professional right away. Women who become pregnant while using this medicine may enroll in the [place] Pregnancy Registry by calling [phone]. This registry collects information about the safety of antiepileptic drug use during pregnancy. What side effects may I notice from receiving this medicine? Side effects that you should report to your doctor or health care professional as soon as possible: -allergic reactions like skin rash, itching or hives, swelling of the face, lips, or tongue -breathing problems -changes in vision -confusion -dark urine -fast or irregular heartbeat -fever or chills, sore throat -mouth  ulcers -pain or difficulty passing urine -redness, blistering, peeling or loosening of the skin, including inside the mouth -ringing in the ears -seizures -stomach pain -swollen joints or muscle/joint aches and pains -unusual bleeding or bruising -unusually weak or tired -vomiting -worsening of mood, thoughts or actions of suicide or dying -yellowing of the eyes or skin Side effects that usually do not require medical attention (report to your doctor or health care professional if they continue or are bothersome): -clumsiness or unsteadiness -diarrhea or constipation -headache -increased sweating -nausea This list may not describe all possible side effects. Call your doctor for medical advice about side effects. You may report side effects to FDA at 1-800-FDA-1088. Where should I keep my medicine? Keep out of reach of children. Store at room temperature below 30 degrees C (86 degrees F). Keep container tightly closed. Protect from moisture. Throw away any unused medicine after the expiration date. NOTE: This sheet is a summary. It may not cover all possible information. If you have questions about this medicine, talk to your doctor, pharmacist, or health care provider.    2016, Elsevier/Gold Standard. (2014-04-15 15:38:34)

## 2017-02-05 ENCOUNTER — Ambulatory Visit (INDEPENDENT_AMBULATORY_CARE_PROVIDER_SITE_OTHER): Payer: BLUE CROSS/BLUE SHIELD | Admitting: Neurology

## 2017-02-05 ENCOUNTER — Encounter: Payer: Self-pay | Admitting: Neurology

## 2017-02-05 DIAGNOSIS — R0683 Snoring: Secondary | ICD-10-CM | POA: Diagnosis not present

## 2017-02-05 DIAGNOSIS — H532 Diplopia: Secondary | ICD-10-CM | POA: Diagnosis not present

## 2017-02-05 DIAGNOSIS — G40909 Epilepsy, unspecified, not intractable, without status epilepticus: Secondary | ICD-10-CM

## 2017-02-05 DIAGNOSIS — Z760 Encounter for issue of repeat prescription: Secondary | ICD-10-CM

## 2017-02-05 MED ORDER — CARBAMAZEPINE 200 MG PO TABS: ORAL_TABLET | ORAL | 11 refills | Status: DC

## 2017-02-05 NOTE — Patient Instructions (Signed)
Carbamazepine tablets What is this medicine? CARBAMAZEPINE (kar ba MAZ e peen) is used to control seizures caused by certain types of epilepsy. This medicine is also used to treat nerve related pain. It is not for common aches and pains. This medicine may be used for other purposes; ask your health care provider or pharmacist if you have questions. COMMON BRAND NAME(S): Epitol, Tegretol What should I tell my health care provider before I take this medicine? They need to know if you have any of these conditions: -Asian ancestry -bone marrow disease -glaucoma -heart disease or irregular heartbeat -kidney disease -liver disease -low blood counts, like low white cell, platelet, or red cell counts -porphyria -psychotic disorders -suicidal thoughts, plans, or attempt; a previous suicide attempt by you or a family member -an unusual or allergic reaction to carbamazepine, tricyclic antidepressants, phenytoin, phenobarbital or other medicines, foods, dyes, or preservatives -pregnant or trying to get pregnant -breast-feeding How should I use this medicine? Take this medicine by mouth with a glass of water. Follow the directions on the prescription label. Take this medicine with food. Take your doses at regular intervals. Do not take your medicine more often than directed. Do not stop taking this medicine except on the advice of your doctor or health care professional. A special MedGuide will be given to you by the pharmacist with each prescription and refill. Be sure to read this information carefully each time. Talk to your pediatrician regarding the use of this medicine in children. Special care may be needed. Overdosage: If you think you have taken too much of this medicine contact a poison control center or emergency room at once. NOTE: This medicine is only for you. Do not share this medicine with others. What if I miss a dose? If you miss a dose, take it as soon as you can. If it is almost  time for your next dose, take only that dose. Do not take double or extra doses. What may interact with this medicine? Do not take this medicine with any of the following medications: -certain medicines used to treat HIV infection or AIDS that are given in combination with cobicistat - delavirdine - MAOIs like Carbex, Eldepryl, Marplan, Nardil, and Parnate - nefazodone - oxcarbazepine This medicine may also interact with the following medications: - acetaminophen - acetazolamide - barbiturate medicines for inducing sleep or treating seizures, like phenobarbital - certain antibiotics like clarithromycin, erythromycin or troleandomycin - cimetidine - cyclosporine - danazol - dicumarol - doxycycline - male hormones, including estrogens and birth control pills - grapefruit juice - isoniazid, INH - levothyroxine and other thyroid hormones - lithium and other medicines to treat mood problems or psychotic disturbances - loratadine - medicines for angina or high blood pressure - medicines for cancer - medicines for depression or anxiety - medicines for sleep - medicines to treat fungal infections, like fluconazole, itraconazole or ketoconazole - medicines used to treat HIV infection or AIDS - methadone - niacinamide - praziquantel - propoxyphene - rifampin or rifabutin - seizure or epilepsy medicine - steroid medicines such as prednisone or cortisone - theophylline - tramadol - warfarin This list may not describe all possible interactions. Give your health care provider a list of all the medicines, herbs, non-prescription drugs, or dietary supplements you use. Also tell them if you smoke, drink alcohol, or use illegal drugs. Some items may interact with your medicine. What should I watch for while using this medicine? Visit your doctor or health care professional for a regular check  on your progress. Do not change brands or dosage forms of this  medicine without discussing the change with your doctor or health care professional. If you are taking this medicine for epilepsy (seizures) do not stop taking it suddenly. This increases the risk of seizures. Wear a Probation officer or necklace. Carry an identification card with information about your condition, medications, and doctor or health care professional. Dennis Bast may get drowsy, dizzy, or have blurred vision. Do not drive, use machinery, or do anything that needs mental alertness until you know how this medicine affects you. To reduce dizzy or fainting spells, do not sit or stand up quickly, especially if you are an older patient. Alcohol can increase drowsiness and dizziness. Avoid alcoholic drinks. Birth control pills may not work properly while you are taking this medicine. Talk to your doctor about using an extra method of birth control. This medicine can make you more sensitive to the sun. Keep out of the sun. If you cannot avoid being in the sun, wear protective clothing and use sunscreen. Do not use sun lamps or tanning beds/booths. The use of this medicine may increase the chance of suicidal thoughts or actions. Pay special attention to how you are responding while on this medicine. Any worsening of mood, or thoughts of suicide or dying should be reported to your health care professional right away. Women who become pregnant while using this medicine may enroll in the [place] Pregnancy Registry by calling [phone]. This registry collects information about the safety of antiepileptic drug use during pregnancy. What side effects may I notice from receiving this medicine? Side effects that you should report to your doctor or health care professional as soon as possible: -allergic reactions like skin rash, itching or hives, swelling of the face, lips, or tongue -breathing problems -changes in vision -confusion -dark urine -fast or irregular heartbeat -fever  or chills, sore throat -mouth ulcers -pain or difficulty passing urine -redness, blistering, peeling or loosening of the skin, including inside the mouth -ringing in the ears -seizures -stomach pain -swollen joints or muscle/joint aches and pains -unusual bleeding or bruising -unusually weak or tired -vomiting -worsening of mood, thoughts or actions of suicide or dying -yellowing of the eyes or skin Side effects that usually do not require medical attention (report to your doctor or health care professional if they continue or are bothersome): -clumsiness or unsteadiness -diarrhea or constipation -headache -increased sweating -nausea This list may not describe all possible side effects. Call your doctor for medical advice about side effects. You may report side effects to FDA at 1-800-FDA-1088. Where should I keep my medicine? Keep out of reach of children. Store at room temperature below 30 degrees C (86 degrees F). Keep container tightly closed. Protect from moisture. Throw away any unused medicine after the expiration date. NOTE: This sheet is a summary. It may not cover all possible information. If you have questions about this medicine, talk to your doctor, pharmacist, or health care provider.  2018 Elsevier/Gold Standard (2014-04-15 15:38:34)

## 2017-02-05 NOTE — Progress Notes (Signed)
[facility]   Provider:  Larey Seat, M D  Referring Provider: Hulan Fess, MD Primary Care Physician:  Hulan Fess, MD  Chief Complaint  Patient presents with  . Follow-up    pt alone, rm 10. pt states no seizure activity    HPI:  Ethan Santana is a 66 y.o. male , seen here as a revisit . Ethan Santana reports no seizure activity since last seen. The last seizure is over 25 years ago. He has remained on Tegretol, well controlled - here for  basic lab testing. CMET and CBC diff.  He has sometimes diplopia, but is not feeling that this is related to tegretol levels.     Consult:  Last seizure over 2 decades ago. Treated by Dr. Erling Cruz until 2014,  he was followed by Dr. Janann Colonel last year and now assigned to me since 2016 . Ethan Santana reports that even during his college years he was well aware of spells and finally suffered a seizure. He was initially on Dilantin, weaning the medication caused seizures to resurface. He could describe sensory abnormalities. He has experienced " sense of doom " , and no seizure activity visible on the outside, no tonic clonic activity. Until 2 years ago,  he was on Tegretol by that time, he would begin to have occasional diplopia.  An EEG confirmed his seizure diagnosis, after the first one was "normal ".   The patient has no restrictions on activities of daily living, operation of machinery's, driving, being in higher altitudes etc. Again he has never suffered a tonic-clonic seizure.  Social history: The patient is married with grown children, is an e book Chief Strategy Officer, Financial risk analyst. He writes" phantasy " novels.The patient has 2 master's degrees,  one in [employer] , and one in education - pedagogic in science .  Interval history from 02/07/2016, the pleasure of seeing Ethan Santana today in a yearly revisit. He has been doing very well his last seizure is over 23 years ago. He has maintained on the same  medication with good success but with a slight side effect progression including balance impairment when in a dark room. His optic control his balance is fine. Has occasionally had to scoot diplopia which also could be attributable to Tegretol (especially if the patient has a "lazy eye").  He is in good health and with good humour. This years DEXA scan was normal.    Review of Systems: Out of a complete 14 system review, the patient complains of only the following symptoms, and all other reviewed systems are negative.   Geriatric depression score;  2/ 15  Snoring, not apnea/ no insomnia.   Social History   Social History  . Marital status: Married    Spouse name: [name]   . Number of children: 2  . Years of education: 12+   Occupational History  . Self empolyed     Social History Main Topics  . Smoking status: Never Smoker  . Smokeless tobacco: Never Used  . Alcohol use No  . Drug use: No  . Sexual activity: Not on file   Other Topics Concern  . Not on file   Social History Narrative   Patient lives at home wife [name]    Have have 2 children.    Patient is right handed.    Patient has his masters   Patient works at home.     Family History  Problem Relation Age of Onset  .  Stroke Mother   . Bone cancer Father   . Cancer Mother     Past Medical History:  Diagnosis Date  . Chronic urticaria   . Diplopia 01/11/2015  . HBP (high blood pressure)   . Migraine   . Refill clinic medication management patient 01/11/2015  . Seizure [facility])     Past Surgical History:  Procedure Laterality Date  . TONSILLECTOMY      Current Outpatient Prescriptions  Medication Sig Dispense Refill  . amphetamine-dextroamphetamine (ADDERALL) 20 MG tablet     . Calcium Carb-Cholecalciferol (CALCIUM 1000 + D PO) Take 1,000 mg by mouth daily.     . carbamazepine (TEGRETOL) 200 MG tablet TAKE 1/2 TABLET IN THE MORNING AND 1 TABLET IN THE EVENING. 45 tablet 11  . Cholecalciferol (VITAMIN D)  2000 UNITS CAPS Take 2,000 Units by mouth daily.    Marland Kitchen CLOBETASOL PROP CREA-COAL TAR EX Apply topically.    . famotidine (PEPCID) 20 MG tablet Take 20 mg by mouth daily.    . fluticasone (FLONASE) 50 MCG/ACT nasal spray     . loratadine (CLARITIN) 10 MG tablet Take 10 mg by mouth daily as needed for allergies.    . nadolol (CORGARD) 20 MG tablet Take by mouth daily.    Marland Kitchen OMEPRAZOLE PO Take by mouth.    . psyllium (REGULOID) 0.52 G capsule Take 0.52 g by mouth 4 (four) times daily.    Marland Kitchen VIAGRA 100 MG tablet      No current facility-administered medications for this visit.     Allergies as of 02/05/2017  . (No Known Allergies)    Vitals: BP 124/80   Pulse 70   Ht 5\' 8"  (1.727 m)   Wt 154 lb (69.9 kg)   BMI 23.42 kg/m  Last Weight:  Wt Readings from Last 1 Encounters:  02/05/17 154 lb (69.9 kg)   TGG:YIRS mass index is 23.42 kg/m.     Last Height:   Ht Readings from Last 1 Encounters:  02/05/17 5\' 8"  (1.727 m)    Physical exam:  General: The patient is awake, alert and appears not in acute distress. The patient is well groomed. Head: Normocephalic, atraumatic. Neck is supple. Cardiovascular:  Regular rate and rhythm , without  murmurs or carotid bruit, and without distended neck veins. Respiratory: Lungs are clear to auscultation. Skin:  Without evidence of edema, or rash Trunk: The patient's posture is erect.  Neurologic exam : The patient is awake and alert, oriented to place and time.   Memory subjective  described as intact.  Attention span & concentration ability appears normal.  Speech is fluent,  Without  dysarthria, dysphonia or aphasia.  Mood and affect are appropriate.  Cranial nerves: Pupils are equal and briskly reactive to light.   Extraocular movements  in vertical and horizontal planes intact and without nystagmus. Visual fields by finger perimetry are intact. [name] reports skewed diplopia, worse when he is fatigued and worse when the weather is  cold. Worse when looking up.  Hearing to finger rub intact. Facial motor strength is symmetric and tongue and uvula move midline. Shoulder shrug was symmetrical.   Motor exam:  Normal tone, muscle bulk and symmetric strength in all extremities. Coordination: Finger-to-nose maneuver normal without evidence of ataxia, dysmetria or tremor. No handwriting changes, no clumsiness.  Gait and station: Patient walks without assistive device and is able unassisted to climb up to the exam table.  Strength within normal limits.Stance is stable and normal. Turns  with 3  Steps. Heel to toe is unsteady , he has normal arm swing. Negative romberg.  Deep tendon reflexes: in the upper and lower extremities are symmetric and intact. Plantar reflex downgoing.  The patient was advised of the nature of the diagnosed sleep disorder,  the treatment options and risks for general a health and wellness arising from not treating the condition.  I spent more than 20 minutes of face to face time with the patient.   Greater than 50% of time was spent in counseling and coordination of care. We have discussed the diagnosis and differential and I answered the patient's questions.    Caucasian gentleman with history of seizures with aura since childhood. He will note a strange sound or music going through his had and sometimes have a sensation" of being touched " preceding the event . Can last anywhere from a few minutes to 30 minutes.  Had abnormal EEGs in the past, started on Dilantin symptoms resolved after 3 years after weaning off Dilantin the patient had the same activity.  Repeat EEGs were normal,  but the patient still experienced the same spells ( auras) he was changed to Tegretol, which she has been well controlled ever since. Mr. Cicero has noted a decline in balance/ coordination capacity to some mild degree progressive, affecting him in a dark room. This may be attributable to Tegretol.   Assessment:  After physical  and neurologic examination, review of laboratory studies,  Personal review of imaging studies, reports of other /same  Imaging studies, results of polysomnography/ neurophysiology testing and pre-existing records as far as provided in visit., my assessment is   1)  well-controlled seizure disorder currently on 200 mg of Tegretol in the evening 100 mg in the morning.   Encouraged continuation of calcium and vitamin D supplements.  Encouraged hydration. Primary care physician usually provides labwork once a year.  He has to be followed for hyponatremia and leukopenia in relation to long-term Tegretol use.  Will obtain lab work in 31 month- alternating with PCP. Marland Kitchen     Asencion Partridge Muneer Leider MD  02/05/2017   CC: Hulan Fess, [address] [place], [place] [address]

## 2017-05-21 DIAGNOSIS — Z13220 Encounter for screening for lipoid disorders: Secondary | ICD-10-CM | POA: Diagnosis not present

## 2017-05-21 DIAGNOSIS — Z79899 Other long term (current) drug therapy: Secondary | ICD-10-CM | POA: Diagnosis not present

## 2017-05-21 DIAGNOSIS — E559 Vitamin D deficiency, unspecified: Secondary | ICD-10-CM | POA: Diagnosis not present

## 2017-05-21 DIAGNOSIS — Z0000 Encounter for general adult medical examination without abnormal findings: Secondary | ICD-10-CM | POA: Diagnosis not present

## 2017-05-21 DIAGNOSIS — Z125 Encounter for screening for malignant neoplasm of prostate: Secondary | ICD-10-CM | POA: Diagnosis not present

## 2017-05-22 DIAGNOSIS — G40909 Epilepsy, unspecified, not intractable, without status epilepticus: Secondary | ICD-10-CM | POA: Diagnosis not present

## 2017-05-22 DIAGNOSIS — R8290 Unspecified abnormal findings in urine: Secondary | ICD-10-CM | POA: Diagnosis not present

## 2017-05-22 DIAGNOSIS — F900 Attention-deficit hyperactivity disorder, predominantly inattentive type: Secondary | ICD-10-CM | POA: Diagnosis not present

## 2017-05-22 DIAGNOSIS — E559 Vitamin D deficiency, unspecified: Secondary | ICD-10-CM | POA: Diagnosis not present

## 2017-05-22 DIAGNOSIS — R7301 Impaired fasting glucose: Secondary | ICD-10-CM | POA: Diagnosis not present

## 2017-05-22 DIAGNOSIS — Z23 Encounter for immunization: Secondary | ICD-10-CM | POA: Diagnosis not present

## 2017-05-22 DIAGNOSIS — Z800 Family history of malignant neoplasm of digestive organs: Secondary | ICD-10-CM | POA: Diagnosis not present

## 2017-05-22 DIAGNOSIS — I10 Essential (primary) hypertension: Secondary | ICD-10-CM | POA: Diagnosis not present

## 2017-05-22 DIAGNOSIS — Z8669 Personal history of other diseases of the nervous system and sense organs: Secondary | ICD-10-CM | POA: Diagnosis not present

## 2017-05-22 DIAGNOSIS — R946 Abnormal results of thyroid function studies: Secondary | ICD-10-CM | POA: Diagnosis not present

## 2017-05-22 DIAGNOSIS — Z0000 Encounter for general adult medical examination without abnormal findings: Secondary | ICD-10-CM | POA: Diagnosis not present

## 2017-05-28 DIAGNOSIS — R8290 Unspecified abnormal findings in urine: Secondary | ICD-10-CM | POA: Diagnosis not present

## 2017-09-12 DIAGNOSIS — L821 Other seborrheic keratosis: Secondary | ICD-10-CM | POA: Diagnosis not present

## 2017-09-12 DIAGNOSIS — D229 Melanocytic nevi, unspecified: Secondary | ICD-10-CM | POA: Diagnosis not present

## 2017-09-12 DIAGNOSIS — L509 Urticaria, unspecified: Secondary | ICD-10-CM | POA: Diagnosis not present

## 2017-10-18 DIAGNOSIS — F4323 Adjustment disorder with mixed anxiety and depressed mood: Secondary | ICD-10-CM | POA: Diagnosis not present

## 2017-11-04 NOTE — Progress Notes (Signed)
[facility]  PATIENT: Ethan Santana DOB: 1950/08/08   REASON FOR VISIT: Follow-up for seizure disorder HISTORY FROM: Patient    HISTORY OF PRESENT ILLNESS:UPDATE 7/2019CM Ethan Santana returns for follow-up with history of seizure disorder.  Last seizure over 26 years ago he remains on Tegretol without side effects.  He is here for labs to monitor adverse effects of Tegretol.  He has had no new medical issues since last seen.  He returns for reevaluation Ethan Santana reports no seizure activity since last seen. The last seizure is over 25 years ago. He has remained on Tegretol, well controlled - here for  basic lab testing. CMET and CBC diff.  He has sometimes diplopia, but is not feeling that this is related to tegretol levels.     Consult:  Last seizure over 2 decades ago. Treated by Dr. Sandria Manly until 2014,  he was followed by Dr. Hosie Poisson last year and now assigned to me since 2016 . Ethan Santana reports that even during his college years he was well aware of spells and finally suffered a seizure. He was initially on Dilantin, weaning the medication caused seizures to resurface. He could describe sensory abnormalities. He has experienced " sense of doom " , and no seizure activity visible on the outside, no tonic clonic activity. Until 2 years ago,  he was on Tegretol by that time, he would begin to have occasional diplopia.  An EEG confirmed his seizure diagnosis, after the first one was "normal ".   The patient has no restrictions on activities of daily living, operation of machinery's, driving, being in higher altitudes etc. Again he has never suffered a tonic-clonic seizure   REVIEW OF SYSTEMS: Full 14 system review of systems performed and notable only for those listed, all others are neg:  Constitutional: neg  Cardiovascular: neg Ear/Nose/Throat: neg  Skin: neg Eyes: neg Respiratory: neg Gastroitestinal: neg  Hematology/Lymphatic: neg    Endocrine: neg Musculoskeletal:neg Allergy/Immunology: neg Neurological: Seizure disorder Psychiatric: neg Sleep : neg   ALLERGIES: No Known Allergies  HOME MEDICATIONS: Outpatient Medications Prior to Visit  Medication Sig Dispense Refill  . amphetamine-dextroamphetamine (ADDERALL) 20 MG tablet     . Calcium Acetate, Phos Binder, (CALCIUM ACETATE PO) Take by mouth.    . carbamazepine (TEGRETOL) 200 MG tablet TAKE 1/2 TABLET IN THE MORNING AND 1 TABLET IN THE EVENING. 45 tablet 11  . Cholecalciferol (VITAMIN D) 2000 UNITS CAPS Take 2,000 Units by mouth daily.    . famotidine (PEPCID) 20 MG tablet Take 20 mg by mouth daily.    . fluticasone (FLONASE) 50 MCG/ACT nasal spray     . nadolol (CORGARD) 40 MG tablet 40 mg daily.    Marland Kitchen OMEPRAZOLE PO Take by mouth.    . psyllium (REGULOID) 0.52 G capsule Take 0.52 g by mouth 4 (four) times daily.    . Calcium Carb-Cholecalciferol (CALCIUM 1000 + D PO) Take 1,000 mg by mouth daily.     Marland Kitchen CLOBETASOL PROP CREA-COAL TAR EX Apply topically.    Marland Kitchen loratadine (CLARITIN) 10 MG tablet Take 10 mg by mouth daily as needed for allergies.    Marland Kitchen VIAGRA 100 MG tablet     . nadolol (CORGARD) 20 MG tablet Take by mouth daily.     No facility-administered medications prior to visit.     PAST MEDICAL HISTORY: Past Medical History:  Diagnosis Date  . Chronic urticaria   . Diplopia 01/11/2015  . HBP (high blood pressure)   .  Migraine   . Refill clinic medication management patient 01/11/2015  . Seizure ([facility])     PAST SURGICAL HISTORY: Past Surgical History:  Procedure Laterality Date  . TONSILLECTOMY      FAMILY HISTORY: Family History  Problem Relation Age of Onset  . Stroke Mother   . Cancer Mother   . Bone cancer Father     SOCIAL HISTORY: Social History   Socioeconomic History  . Marital status: Married    Spouse name: Jenel Lucks   . Number of children: 2  . Years of education: 12+  . Highest education level: Not on file  Occupational  History  . Occupation: Self empolyed   Social Needs  . Financial resource strain: Not on file  . Food insecurity:    Worry: Not on file    Inability: Not on file  . Transportation needs:    Medical: Not on file    Non-medical: Not on file  Tobacco Use  . Smoking status: Never Smoker  . Smokeless tobacco: Never Used  Substance and Sexual Activity  . Alcohol use: No  . Drug use: No  . Sexual activity: Not on file  Lifestyle  . Physical activity:    Days per week: Not on file    Minutes per session: Not on file  . Stress: Not on file  Relationships  . Social connections:    Talks on phone: Not on file    Gets together: Not on file    Attends religious service: Not on file    Active member of club or organization: Not on file    Attends meetings of clubs or organizations: Not on file    Relationship status: Not on file  . Intimate partner violence:    Fear of current or ex partner: Not on file    Emotionally abused: Not on file    Physically abused: Not on file    Forced sexual activity: Not on file  Other Topics Concern  . Not on file  Social History Narrative   Patient lives at home wife Jenel Lucks    Have have 2 children.    Patient is right handed.    Patient has his masters   Patient works at home.      PHYSICAL EXAM  Vitals:   11/05/17 1434  BP: 128/80  Pulse: 65  Weight: 154 lb (69.9 kg)  Height: 5\' 8"  (1.727 m)   Body mass index is 23.42 kg/m.  Generalized: Well developed, in no acute distress  Head: normocephalic and atraumatic,. Oropharynx benign  Neck: Supple,  Musculoskeletal: No deformity   Neurological examination   Mentation: Alert oriented to time, place, history taking. Attention span and concentration appropriate. Recent and remote memory intact.  Follows all commands speech and language fluent.   Cranial nerve II-XII: Pupils were equal round reactive to light extraocular movements were full, visual field were full on confrontational test.  Facial sensation and strength were normal. hearing was intact to finger rubbing bilaterally. Uvula tongue midline. head turning and shoulder shrug were normal and symmetric.Tongue protrusion into cheek strength was normal. Motor: normal bulk and tone, full strength in the BUE, BLE,  Sensory: normal and symmetric to light touch,  Coordination: finger-nose-finger, heel-to-shin bilaterally, no dysmetria Reflexes: Symmetric upper and lower plantar responses were flexor bilaterally. Gait and Station: Rising up from seated position without assistance, normal stance,  moderate stride, good arm swing, smooth turning, able to perform tiptoe, and heel walking without difficulty. Tandem gait is steady.  Romberg negative  DIAGNOSTIC DATA (LABS, IMAGING, TESTING)  ASSESSMENT AND PLAN  67 y.o. year old Santana here to follow-up for well-controlled seizure disorder.  Currently on Tegretol without side effects.     Encouraged continuation of calcium and vitamin D supplements.  Encouraged hydration. Primary care physician usually provides labwork once a year.  He has to be followed for hyponatremia and leukopenia in relation to long-term Tegretol use.    PLAN: Continue Tegretol at current dose does not need refills Get labs today CBC CMP   CBZ level to monitor for hyperemia and leukopenia forward to Dr. Clarene Duke Continue calcium and vitamin D supplements  follow-up in 9 months Nilda Riggs, [facility], [facility], APRN  [facility] [address], [address] [place], [place] [address] [phone]

## 2017-11-05 ENCOUNTER — Ambulatory Visit (INDEPENDENT_AMBULATORY_CARE_PROVIDER_SITE_OTHER): Payer: PPO | Admitting: Nurse Practitioner

## 2017-11-05 ENCOUNTER — Encounter: Payer: Self-pay | Admitting: Nurse Practitioner

## 2017-11-05 ENCOUNTER — Telehealth: Payer: Self-pay | Admitting: Nurse Practitioner

## 2017-11-05 ENCOUNTER — Ambulatory Visit: Payer: BLUE CROSS/BLUE SHIELD | Admitting: Adult Health

## 2017-11-05 DIAGNOSIS — G40909 Epilepsy, unspecified, not intractable, without status epilepticus: Secondary | ICD-10-CM | POA: Diagnosis not present

## 2017-11-05 DIAGNOSIS — Z5181 Encounter for therapeutic drug level monitoring: Secondary | ICD-10-CM | POA: Diagnosis not present

## 2017-11-05 NOTE — Patient Instructions (Signed)
Continue Tegretol at current dose does not need refills Get labs today CBC CMP and CBZ level Follow-up in 9 months

## 2017-11-05 NOTE — Telephone Encounter (Signed)
Patient requested three medications be removed from his med list. He stated he had not taken them in over a year.  Medication list was updated.

## 2017-11-06 LAB — COMPREHENSIVE METABOLIC PANEL
ALK PHOS: 89 IU/L (ref 39–117)
ALT: 15 IU/L (ref 0–44)
AST: 16 IU/L (ref 0–40)
Albumin/Globulin Ratio: 1.7 (ref 1.2–2.2)
Albumin: 4.3 g/dL (ref 3.6–4.8)
BILIRUBIN TOTAL: 0.4 mg/dL (ref 0.0–1.2)
BUN/Creatinine Ratio: 21 (ref 10–24)
BUN: 20 mg/dL (ref 8–27)
CHLORIDE: 92 mmol/L — AB (ref 96–106)
CO2: 26 mmol/L (ref 20–29)
CREATININE: 0.97 mg/dL (ref 0.76–1.27)
Calcium: 9.5 mg/dL (ref 8.6–10.2)
GFR calc Af Amer: 94 mL/min/{1.73_m2} (ref >59)
GFR calc non Af Amer: 82 mL/min/{1.73_m2} (ref >59)
GLUCOSE: 100 mg/dL — AB (ref 65–99)
Globulin, Total: 2.5 g/dL (ref 1.5–4.5)
Potassium: 4.9 mmol/L (ref 3.5–5.2)
Sodium: 131 mmol/L — ABNORMAL LOW (ref 134–144)
Total Protein: 6.8 g/dL (ref 6.0–8.5)

## 2017-11-06 LAB — CARBAMAZEPINE LEVEL, TOTAL: Carbamazepine (Tegretol), S: 7.2 ug/mL (ref 4.0–12.0)

## 2017-11-06 LAB — CBC WITH DIFFERENTIAL/PLATELET
Basophils Absolute: 0 10*3/uL (ref 0.0–0.2)
Basos: 1 %
EOS (ABSOLUTE): 0.1 10*3/uL (ref 0.0–0.4)
EOS: 3 %
HEMOGLOBIN: 15.8 g/dL (ref 13.0–17.7)
Hematocrit: 44.3 % (ref 37.5–51.0)
Immature Grans (Abs): 0 10*3/uL (ref 0.0–0.1)
Immature Granulocytes: 0 %
LYMPHS ABS: 1.4 10*3/uL (ref 0.7–3.1)
Lymphs: 28 %
MCH: 31.3 pg (ref 26.6–33.0)
MCHC: 35.7 g/dL (ref 31.5–35.7)
MCV: 88 fL (ref 79–97)
MONOCYTES: 12 %
Monocytes Absolute: 0.6 10*3/uL (ref 0.1–0.9)
NEUTROS ABS: 2.8 10*3/uL (ref 1.4–7.0)
NEUTROS PCT: 56 %
Platelets: 142 10*3/uL — ABNORMAL LOW (ref 150–450)
RBC: 5.05 x10E6/uL (ref 4.14–5.80)
RDW: 14.1 % (ref 12.3–15.4)
WBC: 4.9 10*3/uL (ref 3.4–10.8)

## 2017-11-11 ENCOUNTER — Telehealth: Payer: Self-pay | Admitting: *Deleted

## 2017-11-11 NOTE — Telephone Encounter (Signed)
LVM informing patient that his labs show a good level of carbamazepine. His  sodium is 131, platelets 142. Advised he  Add a little salt to his diet.  Advised him this RN will send a copy to his primary care. Left number for any questions.  Labs successfully faxed to Dr Hulan Fess.

## 2017-11-21 NOTE — Telephone Encounter (Signed)
Dr Brett Fairy is ok with the patient having yearly apt made. He is a compliant CPAP user and there is no need for a follow up sooner unless pt feels the need to do so. Will call him and make him aware also.

## 2017-11-25 NOTE — Telephone Encounter (Signed)
Called the patient to make him aware that Dr Dohmeier over viewed his most recent office visit and agrees that the patient should be soon yearly since he is compliant. There was no answer. LVM informing him of this information and asked him to call back so that we could get him scheduled for his yearly office visit.   When pt calls back please schedule yearly can be with MD or NP. Pt will probably want MD. Please advise the pt that he can always call us if he is having issues between now and the year apt that is scheduled and we can always attempt to schedule sooner if a sooner apt is needed. Dr Brett Fairy did agree pt should follow yearly

## 2017-12-05 DIAGNOSIS — F900 Attention-deficit hyperactivity disorder, predominantly inattentive type: Secondary | ICD-10-CM | POA: Diagnosis not present

## 2018-03-04 ENCOUNTER — Other Ambulatory Visit: Payer: Self-pay | Admitting: *Deleted

## 2018-03-04 ENCOUNTER — Telehealth: Payer: Self-pay | Admitting: Nurse Practitioner

## 2018-03-04 DIAGNOSIS — R0683 Snoring: Secondary | ICD-10-CM

## 2018-03-04 DIAGNOSIS — H532 Diplopia: Secondary | ICD-10-CM

## 2018-03-04 DIAGNOSIS — Z760 Encounter for issue of repeat prescription: Secondary | ICD-10-CM

## 2018-03-04 DIAGNOSIS — G40909 Epilepsy, unspecified, not intractable, without status epilepticus: Secondary | ICD-10-CM

## 2018-03-04 MED ORDER — CARBAMAZEPINE 200 MG PO TABS: ORAL_TABLET | ORAL | 11 refills | Status: DC

## 2018-03-04 NOTE — Telephone Encounter (Signed)
Done

## 2018-03-04 NOTE — Telephone Encounter (Signed)
Brooke with [facility] requesting refills for carbamazepine (TEGRETOL) 200 MG tablet

## 2018-03-22 DIAGNOSIS — M549 Dorsalgia, unspecified: Secondary | ICD-10-CM | POA: Diagnosis not present

## 2018-03-25 DIAGNOSIS — M5441 Lumbago with sciatica, right side: Secondary | ICD-10-CM | POA: Diagnosis not present

## 2018-04-03 ENCOUNTER — Emergency Department (HOSPITAL_COMMUNITY)
Admission: EM | Admit: 2018-04-03 | Discharge: 2018-04-03 | Disposition: A | Discharge status: Home / Self Care | Payer: PPO | Attending: Emergency Medicine | Admitting: Emergency Medicine

## 2018-04-03 ENCOUNTER — Encounter (HOSPITAL_COMMUNITY): Payer: Self-pay

## 2018-04-03 ENCOUNTER — Other Ambulatory Visit: Payer: Self-pay

## 2018-04-03 DIAGNOSIS — X500XXA Overexertion from strenuous movement or load, initial encounter: Secondary | ICD-10-CM | POA: Diagnosis not present

## 2018-04-03 DIAGNOSIS — I10 Essential (primary) hypertension: Secondary | ICD-10-CM | POA: Insufficient documentation

## 2018-04-03 DIAGNOSIS — M5442 Lumbago with sciatica, left side: Secondary | ICD-10-CM | POA: Diagnosis not present

## 2018-04-03 DIAGNOSIS — Y9389 Activity, other specified: Secondary | ICD-10-CM | POA: Insufficient documentation

## 2018-04-03 DIAGNOSIS — Y999 Unspecified external cause status: Secondary | ICD-10-CM | POA: Insufficient documentation

## 2018-04-03 DIAGNOSIS — Z79899 Other long term (current) drug therapy: Secondary | ICD-10-CM | POA: Diagnosis not present

## 2018-04-03 DIAGNOSIS — M5489 Other dorsalgia: Secondary | ICD-10-CM | POA: Diagnosis not present

## 2018-04-03 DIAGNOSIS — Y929 Unspecified place or not applicable: Secondary | ICD-10-CM | POA: Insufficient documentation

## 2018-04-03 DIAGNOSIS — R52 Pain, unspecified: Secondary | ICD-10-CM | POA: Diagnosis not present

## 2018-04-03 DIAGNOSIS — M545 Low back pain: Secondary | ICD-10-CM | POA: Diagnosis not present

## 2018-04-03 MED ORDER — METHOCARBAMOL 500 MG PO TABS
1000.0000 mg | ORAL_TABLET | Freq: Once | ORAL | Status: AC
  Administered 2018-04-03: 1000 mg via ORAL
  Filled 2018-04-03: qty 2

## 2018-04-03 MED ORDER — DEXAMETHASONE SODIUM PHOSPHATE 10 MG/ML IJ SOLN
10.0000 mg | Freq: Once | INTRAMUSCULAR | Status: AC
  Administered 2018-04-03: 10 mg via INTRAVENOUS
  Filled 2018-04-03: qty 1

## 2018-04-03 MED ORDER — LIDOCAINE 5 % EX PTCH: 1.0000 | MEDICATED_PATCH | CUTANEOUS | 0 refills | Status: DC

## 2018-04-03 MED ORDER — MORPHINE SULFATE (PF) 4 MG/ML IV SOLN
4.0000 mg | Freq: Once | INTRAVENOUS | Status: AC
  Administered 2018-04-03: 4 mg via INTRAVENOUS
  Filled 2018-04-03: qty 1

## 2018-04-03 MED ORDER — KETOROLAC TROMETHAMINE 30 MG/ML IJ SOLN
15.0000 mg | Freq: Once | INTRAMUSCULAR | Status: AC
  Administered 2018-04-03: 15 mg via INTRAVENOUS
  Filled 2018-04-03: qty 1

## 2018-04-03 MED ORDER — METHOCARBAMOL 500 MG PO TABS: 500.0000 mg | ORAL_TABLET | Freq: Two times a day (BID) | ORAL | 0 refills | Status: DC

## 2018-04-03 MED ORDER — HYDROCODONE-ACETAMINOPHEN 5-325 MG PO TABS: 1.0000 | ORAL_TABLET | Freq: Four times a day (QID) | ORAL | 0 refills | Status: DC | PRN

## 2018-04-03 MED ORDER — ONDANSETRON HCL 4 MG/2ML IJ SOLN
4.0000 mg | Freq: Once | INTRAMUSCULAR | Status: AC
  Administered 2018-04-03: 4 mg via INTRAVENOUS
  Filled 2018-04-03: qty 2

## 2018-04-03 NOTE — ED Provider Notes (Addendum)
[facility] DEPT Provider Note   CSN: [id number] Arrival date & time: 04/03/18  3810     History   Chief Complaint Chief Complaint  Patient presents with  . Back Pain    HPI Ethan Santana is a 67 y.o. male.  HPI   Ethan Santana is a 67 y.o. male, with a history of HTN and seizures, presenting to the ED with left lower back pain for the last 3 weeks, worse tonight.  Pain originally started when he bent down to pick something up.  Pain waxes and wanes, is mostly due to spasms, ranges from mild to severe, is worse on the left, radiates into the left buttock.  Accompanied by tingling in the leg. He has been evaluated by his PCP and has been prescribed 2 different muscle relaxers, including tizanidine and another he cannot remember.  He has tried ibuprofen as well as a prescribed nabumetone.  Denies weakness, numbness, falls/trauma, abdominal pain, changes in bowel or bladder function, saddle anesthesias, or any other complaints.    Past Medical History:  Diagnosis Date  . Chronic urticaria   . Diplopia 01/11/2015  . HBP (high blood pressure)   . Migraine   . Refill clinic medication management patient 01/11/2015  . Seizure [facility])     Patient Active Problem List   Diagnosis Date Noted  . Seizure disorder ([place]) 11/05/2017  . Therapeutic drug monitoring 11/05/2017  . Snoring 02/07/2016  . Refill clinic medication management patient 01/11/2015  . Diplopia 01/11/2015  . Recurrent seizures ([place]) 12/12/2012    Past Surgical History:  Procedure Laterality Date  . TONSILLECTOMY          Home Medications    Prior to Admission medications   Medication Sig Start Date End Date Taking? Authorizing Provider  amphetamine-dextroamphetamine (ADDERALL) 20 MG tablet Take 20 mg by mouth daily as needed (adhd).  10/16/12  Yes [provider]  aspirin-acetaminophen-caffeine (EXCEDRIN MIGRAINE) [phone] MG tablet Take 1 tablet by mouth daily as  needed for headache or migraine.   Yes [provider]  calcium carbonate (TUMS EX) 750 MG chewable tablet Chew 1 tablet by mouth daily.   Yes [provider]  carbamazepine (TEGRETOL) 200 MG tablet TAKE 1/2 TABLET IN THE MORNING AND 1 TABLET IN THE EVENING. 03/04/18  Yes Dennie Bible, NP  Cholecalciferol (VITAMIN D) 2000 UNITS CAPS Take 2,000 Units by mouth daily.   Yes [provider]  famotidine (PEPCID) 20 MG tablet Take 20 mg by mouth daily.   Yes [provider]  fluticasone Asencion Islam) 50 MCG/ACT nasal spray  11/30/12  Yes [provider]  ibuprofen (ADVIL,MOTRIN) 200 MG tablet Take 200-600 mg by mouth 3 (three) times daily as needed (severe pain).   Yes [provider]  Multiple Vitamins-Minerals (CENTRUM SILVER PO) Take 1 tablet by mouth daily.   Yes [provider]  nabumetone (RELAFEN) 500 MG tablet Take 500 mg by mouth 2 (two) times daily as needed (Back pain).   Yes [provider]  nadolol (CORGARD) 40 MG tablet 40 mg daily. 09/09/17  Yes [provider]  naproxen sodium (ALEVE) 220 MG tablet Take 220-440 mg by mouth 2 (two) times daily as needed (pain, arthritis).   Yes [provider]  OMEPRAZOLE PO Take 20 mg by mouth daily as needed (indigestion).    Yes [provider]  pseudoephedrine (SUDAFED) 30 MG tablet Take 30 mg by mouth daily as needed for congestion.   Yes  [provider]  psyllium (REGULOID) 0.52 G capsule Take 0.52 g by mouth 4 (four) times daily.   Yes [provider]  sodium chloride (OCEAN) 0.65 % SOLN nasal spray Place 1 spray into both nostrils as needed for congestion.   Yes [provider]  tiZANidine (ZANAFLEX) 4 MG tablet Take 2-4 mg by mouth daily as needed. 03/25/18  Yes [provider]  HYDROcodone-acetaminophen (NORCO/VICODIN) 5-325 MG tablet Take 1-2 tablets by mouth every 6 (six) hours as needed for severe pain.  04/03/18   Joy, Shawn C, PA-C  lidocaine (LIDODERM) 5 % Place 1 patch onto the skin daily. Remove & Discard patch within 12 hours or as directed by MD 04/03/18   Joy, Shawn C, PA-C  methocarbamol (ROBAXIN) 500 MG tablet Take 1 tablet (500 mg total) by mouth 2 (two) times daily. 04/03/18   Joy, Helane Gunther, PA-C    Family History Family History  Problem Relation Age of Onset  . Stroke Mother   . Cancer Mother   . Bone cancer Father     Social History Social History   Tobacco Use  . Smoking status: Never Smoker  . Smokeless tobacco: Never Used  Substance Use Topics  . Alcohol use: No  . Drug use: No     Allergies   Patient has no known allergies.   Review of Systems Review of Systems  Constitutional: Negative for fever.  Cardiovascular: Negative for leg swelling.  Gastrointestinal: Negative for abdominal pain, nausea and vomiting.  Musculoskeletal: Positive for back pain. Negative for neck pain.  Neurological: Negative for weakness and numbness.  All other systems reviewed and are negative.    Physical Exam Updated Vital Signs BP 134/86 (BP Location: Left Arm)   Pulse 71   Temp 97.9 F (36.6 C) (Oral)   Resp 18   Ht 5\' 8"  (1.727 m)   Wt 68 kg   SpO2 98%   BMI 22.81 kg/m   Physical Exam  Constitutional: He appears well-developed and well-nourished. No distress.  HENT:  Head: Normocephalic and atraumatic.  Eyes: Conjunctivae are normal.  Neck: Neck supple.  Cardiovascular: Normal rate, regular rhythm and intact distal pulses.  Pulmonary/Chest: Effort normal. No respiratory distress.  Abdominal: Soft. There is no tenderness. There is no guarding.  Musculoskeletal: He exhibits tenderness. He exhibits no edema.       Back:  Neurological: He is alert.  Sensation grossly intact to light touch in the lower extremities bilaterally. No saddle anesthesias. Strength 5/5 in the bilateral lower extremities. Slow, antalgic gait, but ambulates independently.  Skin: Skin  is warm and dry. He is not diaphoretic.  Psychiatric: He has a normal mood and affect. His behavior is normal.  Nursing note and vitals reviewed.    ED Treatments / Results  Labs (all labs ordered are listed, but only abnormal results are displayed) Labs Reviewed - No data to display  EKG None  Radiology No results found.  Procedures Procedures (including critical care time)  Medications Ordered in ED Medications  methocarbamol (ROBAXIN) tablet 1,000 mg (1,000 mg Oral Given 04/03/18 0634)  morphine 4 MG/ML injection 4 mg (4 mg Intravenous Given 04/03/18 0633)  ondansetron (ZOFRAN) injection 4 mg (4 mg Intravenous Given 04/03/18 0630)  dexamethasone (DECADRON) injection 10 mg (10 mg Intravenous Given 04/03/18 0631)  ketorolac (TORADOL) 30 MG/ML injection 15 mg (15 mg Intravenous Given 04/03/18 0630)     Initial Impression / Assessment and Plan / ED Course  I have reviewed  the triage vital signs and the nursing notes.  Pertinent labs & imaging results that were available during my care of the patient were reviewed by me and considered in my medical decision making (see chart for details).  Clinical Course as of Apr 03 837  Thu Apr 03, 2018  0705 Pain has improved. Pain now rates 1/10.   [SJ]  0820 Patient voices continued improvement.  He is now able to sit up on the edge of the bed and ambulate without significant pain.   [SJ]    Clinical Course User Index [SJ] Joy, Shawn C, PA-C    Patient presents with back pain over the last 3 weeks.  He has no red flag symptoms that would require emergent MRI and presentation is not suggestive of surgical emergency.  Patient improved through ED course. He will follow-up with his PCP versus orthopedics. The patient was given instructions for home care as well as return precautions. Patient voices understanding of these instructions, accepts the plan, and is comfortable with discharge.   Findings and plan of care discussed with Dr.  Florina Ou. Dr. Florina Ou personally evaluated and examined this patient.  Final Clinical Impressions(s) / ED Diagnoses   Final diagnoses:  Acute bilateral low back pain with left-sided sciatica    ED Discharge Orders         Ordered    methocarbamol (ROBAXIN) 500 MG tablet  2 times daily     04/03/18 0752    HYDROcodone-acetaminophen (NORCO/VICODIN) 5-325 MG tablet  Every 6 hours PRN     04/03/18 0752    lidocaine (LIDODERM) 5 %  Every 24 hours     04/03/18 [place], Shawn C, PA-C 04/03/18 [place], [place] C, PA-C 04/03/18 0838    Shanon Rosser, MD 04/06/18 2242

## 2018-04-03 NOTE — ED Triage Notes (Signed)
Patient arrives by [facility] from home with complaints of lower back pain and spasms for the past 3 weeks-under current tx by PCP-on 2 different muscle relaxers and NSAIDS with minimal relief. Patient ran out of his muscle relaxer and he called the office and they gave him another NSAID but no muscle relaxer-patient states awoke at midnight with pain and spasming to lower back.

## 2018-04-03 NOTE — ED Notes (Signed)
Bed: KO46 Expected date:  Expected time:  Means of arrival:  Comments: EMS 67 yo male lower back pain and spasms for 3 weeks-under current tx by PCP-pain most severe tonight

## 2018-04-03 NOTE — Discharge Instructions (Addendum)
Take it easy, but do not lay around too much as this may make any stiffness worse.  Antiinflammatory medications: Take 600 mg of ibuprofen every 6 hours or 440 mg (over the counter dose) to 500 mg (prescription dose) of naproxen every 12 hours for the next 3 days. After this time, these medications may be used as needed for pain. Take these medications with food to avoid upset stomach. Choose only one of these medications, do not take them together. Alternatively, you may continue using the nabumetone.  Acetaminophen (generic for Tylenol): Should you continue to have additional pain while taking the ibuprofen or naproxen, you may add in acetaminophen as needed. Your daily total maximum amount of acetaminophen from all sources should be limited to 4000mg /day for persons without liver problems, or 2000mg /day for those with liver problems. Vicodin: May take Vicodin (hydrocodone-acetaminophen) as needed for severe pain.  Do not drive or perform other dangerous activities while taking the Vicodin.  Please note that each pill of Vicodin contains 325 mg of acetaminophen (Tylenol) and the above dosage limits apply. Muscle relaxer: Robaxin is a muscle relaxer and may help loosen stiff muscles. Do not take the Robaxin while driving or performing other dangerous activities.  Lidocaine patches: These are available via either prescription or over-the-counter. The over-the-counter option may be more economical one and are likely just as effective. There are multiple over-the-counter brands, such as Salonpas. Exercises: Be sure to perform the attached exercises starting with three times a week and working up to performing them daily. This is an essential part of preventing long term problems.  Follow up: Follow up with a primary care provider or orthopedist for any future management of these complaints. Be sure to follow up within 7-10 days. Return: Return to the ED should symptoms worsen, you experience weakness, loss of  sensation, loss of bowel or bladder control, difficulty moving your bowels or bladder, or any other major concerns.  For prescription assistance, may try using prescription discount sites or apps, such as goodrx.com

## 2018-04-05 ENCOUNTER — Other Ambulatory Visit: Payer: Self-pay

## 2018-04-05 ENCOUNTER — Emergency Department (HOSPITAL_COMMUNITY)
Admission: EM | Admit: 2018-04-05 | Discharge: 2018-04-05 | Disposition: A | Discharge status: Home / Self Care | Payer: PPO | Attending: Emergency Medicine | Admitting: Emergency Medicine

## 2018-04-05 ENCOUNTER — Encounter (HOSPITAL_COMMUNITY): Payer: Self-pay

## 2018-04-05 DIAGNOSIS — M62830 Muscle spasm of back: Secondary | ICD-10-CM | POA: Insufficient documentation

## 2018-04-05 DIAGNOSIS — R52 Pain, unspecified: Secondary | ICD-10-CM | POA: Diagnosis not present

## 2018-04-05 DIAGNOSIS — M5416 Radiculopathy, lumbar region: Secondary | ICD-10-CM | POA: Diagnosis not present

## 2018-04-05 DIAGNOSIS — Z79899 Other long term (current) drug therapy: Secondary | ICD-10-CM | POA: Diagnosis not present

## 2018-04-05 DIAGNOSIS — R252 Cramp and spasm: Secondary | ICD-10-CM | POA: Diagnosis not present

## 2018-04-05 DIAGNOSIS — E871 Hypo-osmolality and hyponatremia: Secondary | ICD-10-CM | POA: Insufficient documentation

## 2018-04-05 DIAGNOSIS — M5489 Other dorsalgia: Secondary | ICD-10-CM | POA: Diagnosis not present

## 2018-04-05 DIAGNOSIS — M545 Low back pain: Secondary | ICD-10-CM | POA: Diagnosis not present

## 2018-04-05 DIAGNOSIS — I10 Essential (primary) hypertension: Secondary | ICD-10-CM | POA: Diagnosis not present

## 2018-04-05 LAB — I-STAT CHEM 8, ED
BUN: 24 mg/dL — AB (ref 8–23)
Calcium, Ion: 1.1 mmol/L — ABNORMAL LOW (ref 1.15–1.40)
Chloride: 92 mmol/L — ABNORMAL LOW (ref 98–111)
Creatinine, Ser: 0.8 mg/dL (ref 0.61–1.24)
Glucose, Bld: 96 mg/dL (ref 70–99)
HCT: 48 % (ref 39.0–52.0)
Hemoglobin: 16.3 g/dL (ref 13.0–17.0)
Potassium: 3.7 mmol/L (ref 3.5–5.1)
SODIUM: 130 mmol/L — AB (ref 135–145)
TCO2: 28 mmol/L (ref 22–32)

## 2018-04-05 MED ORDER — PREDNISONE 10 MG (21) PO TBPK: ORAL_TABLET | Freq: Every day | ORAL | 0 refills | Status: DC

## 2018-04-05 MED ORDER — SODIUM CHLORIDE 0.9 % IV BOLUS: 1000.0000 mL | Freq: Once | INTRAVENOUS | Status: DC

## 2018-04-05 MED ORDER — HYDROMORPHONE HCL 1 MG/ML IJ SOLN
0.5000 mg | Freq: Once | INTRAMUSCULAR | Status: AC
  Administered 2018-04-05: 0.5 mg via INTRAVENOUS
  Filled 2018-04-05: qty 1

## 2018-04-05 MED ORDER — HYDROCODONE-ACETAMINOPHEN 5-325 MG PO TABS: 2.0000 | ORAL_TABLET | ORAL | 0 refills | Status: DC | PRN

## 2018-04-05 MED ORDER — DIAZEPAM 5 MG PO TABS
5.0000 mg | ORAL_TABLET | Freq: Once | ORAL | Status: AC
  Administered 2018-04-05: 5 mg via ORAL
  Filled 2018-04-05: qty 1

## 2018-04-05 MED ORDER — DEXAMETHASONE SODIUM PHOSPHATE 10 MG/ML IJ SOLN
10.0000 mg | Freq: Once | INTRAMUSCULAR | Status: AC
  Administered 2018-04-05: 10 mg via INTRAVENOUS
  Filled 2018-04-05: qty 1

## 2018-04-05 MED ORDER — KETOROLAC TROMETHAMINE 30 MG/ML IJ SOLN
15.0000 mg | Freq: Once | INTRAMUSCULAR | Status: AC
  Administered 2018-04-05: 15 mg via INTRAVENOUS
  Filled 2018-04-05: qty 1

## 2018-04-05 MED ORDER — DIAZEPAM 5 MG PO TABS: 5.0000 mg | ORAL_TABLET | Freq: Three times a day (TID) | ORAL | 0 refills | Status: DC | PRN

## 2018-04-05 NOTE — ED Notes (Signed)
Bed: WA02 Expected date:  Expected time:  Means of arrival:  Comments: 

## 2018-04-05 NOTE — ED Provider Notes (Signed)
[facility] DEPT Provider Note   CSN: [id number] Arrival date & time: 04/05/18  0606     History   Chief Complaint Chief Complaint  Patient presents with  . Leg Pain    HPI Ethan Santana is a 67 y.o. male.  HPI Ethan Santana is a 67 y.o. male presents to emergency department with complaint of back pain.  He states his symptoms began 3 weeks ago after he bent down to pick something up.  Since then pain would come and go.  Pain it was initially just in the left lower back, now radiates down both legs.  He reports numbness to the right anterior thigh.  He reports spasms in the left lower back and left buttock.  He denies any numbness or tingling to his feet.  He denies any weakness to his legs.  He does not have any trouble controlling his bowels or bladder.  There is no abdominal pain.  No urinary symptoms.  He states he went to primary care doctor and was prescribed to muscle relaxants and ibuprofen and nabumetone which are not helping.  He states he pretty much has been laying in bed for the last 2 weeks because it is very painful to get up out of bed and get back into the bed.  He states he is unable to walk up straight, he states he has to bend over when walking.  He has never had similar pain like this in the past.  Past Medical History:  Diagnosis Date  . Chronic urticaria   . Diplopia 01/11/2015  . HBP (high blood pressure)   . Migraine   . Refill clinic medication management patient 01/11/2015  . Seizure [facility])     Patient Active Problem List   Diagnosis Date Noted  . Seizure disorder ([place]) 11/05/2017  . Therapeutic drug monitoring 11/05/2017  . Snoring 02/07/2016  . Refill clinic medication management patient 01/11/2015  . Diplopia 01/11/2015  . Recurrent seizures ([place]) 12/12/2012    Past Surgical History:  Procedure Laterality Date  . TONSILLECTOMY          Home Medications    Prior to Admission medications   Medication Sig  Start Date End Date Taking? Authorizing Provider  amphetamine-dextroamphetamine (ADDERALL) 20 MG tablet Take 20 mg by mouth daily as needed (adhd).  10/16/12   [provider]  aspirin-acetaminophen-caffeine (EXCEDRIN MIGRAINE) [phone] MG tablet Take 1 tablet by mouth daily as needed for headache or migraine.    [provider]  calcium carbonate (TUMS EX) 750 MG chewable tablet Chew 1 tablet by mouth daily.    [provider]  carbamazepine (TEGRETOL) 200 MG tablet TAKE 1/2 TABLET IN THE MORNING AND 1 TABLET IN THE EVENING. 03/04/18   Dennie Bible, NP  Cholecalciferol (VITAMIN D) 2000 UNITS CAPS Take 2,000 Units by mouth daily.    [provider]  famotidine (PEPCID) 20 MG tablet Take 20 mg by mouth daily.    [provider]  fluticasone Asencion Islam) 50 MCG/ACT nasal spray  11/30/12   [provider]  HYDROcodone-acetaminophen (NORCO/VICODIN) 5-325 MG tablet Take 1-2 tablets by mouth every 6 (six) hours as needed for severe pain. 04/03/18   Joy, Shawn C, PA-C  ibuprofen (ADVIL,MOTRIN) 200 MG tablet Take 200-600 mg by mouth 3 (three) times daily as needed (severe pain).    [provider]  lidocaine (LIDODERM) 5 % Place 1 patch onto the skin daily. Remove & Discard patch within 12 hours or  as directed by MD 04/03/18   Lorayne Bender, PA-C  methocarbamol (ROBAXIN) 500 MG tablet Take 1 tablet (500 mg total) by mouth 2 (two) times daily. 04/03/18   Joy, Shawn C, PA-C  Multiple Vitamins-Minerals (CENTRUM SILVER PO) Take 1 tablet by mouth daily.    [provider]  nabumetone (RELAFEN) 500 MG tablet Take 500 mg by mouth 2 (two) times daily as needed (Back pain).    [provider]  nadolol (CORGARD) 40 MG tablet 40 mg daily. 09/09/17   [provider]  naproxen sodium (ALEVE) 220 MG tablet Take 220-440 mg by mouth 2 (two) times daily as needed (pain, arthritis).    [provider]  OMEPRAZOLE PO Take 20  mg by mouth daily as needed (indigestion).     [provider]  pseudoephedrine (SUDAFED) 30 MG tablet Take 30 mg by mouth daily as needed for congestion.    [provider]  psyllium (REGULOID) 0.52 G capsule Take 0.52 g by mouth 4 (four) times daily.    [provider]  sodium chloride (OCEAN) 0.65 % SOLN nasal spray Place 1 spray into both nostrils as needed for congestion.    [provider]  tiZANidine (ZANAFLEX) 4 MG tablet Take 2-4 mg by mouth daily as needed. 03/25/18   [provider]    Family History Family History  Problem Relation Age of Onset  . Stroke Mother   . Cancer Mother   . Bone cancer Father     Social History Social History   Tobacco Use  . Smoking status: Never Smoker  . Smokeless tobacco: Never Used  Substance Use Topics  . Alcohol use: No  . Drug use: No     Allergies   Patient has no known allergies.   Review of Systems Review of Systems  Constitutional: Negative for chills and fever.  Musculoskeletal: Positive for arthralgias and back pain.  Neurological: Negative for weakness and numbness.     Physical Exam Updated Vital Signs Ht 5\' 8"  (1.727 m)   Wt 68 kg   BMI 22.81 kg/m   Physical Exam  Constitutional: He appears well-developed and well-nourished. No distress.  HENT:  Head: Normocephalic and atraumatic.  Eyes: Conjunctivae are normal.  Neck: Neck supple.  Musculoskeletal: He exhibits no edema.  ttp over left SI joint and left lower back. Pain with bilateral straight leg raise. DP pulses intact and equal bilaterally.  Neurological: He is alert.  5/5 and equal lower extremity strength. 2+ and equal patellar reflexes bilaterally. Pt able to dorsiflex bilateral toes and feet with good strength against resistance. Equal sensation bilaterally over thighs and lower legs.   Skin: Skin is warm and dry.  Nursing note and vitals reviewed.    ED Treatments / Results  Labs (all labs ordered  are listed, but only abnormal results are displayed) Labs Reviewed  I-STAT CHEM 8, ED - Abnormal; Notable for the following components:      Result Value   Sodium 130 (*)    Chloride 92 (*)    BUN 24 (*)    Calcium, Ion 1.10 (*)    All other components within normal limits    EKG None  Radiology No results found.  Procedures Procedures (including critical care time)  Medications Ordered in ED Medications  diazepam (VALIUM) tablet 5 mg (has no administration in time range)  dexamethasone (DECADRON) injection 10 mg (has no administration in time range)  HYDROmorphone (DILAUDID) injection 0.5 mg (has no  administration in time range)     Initial Impression / Assessment and Plan / ED Course  I have reviewed the triage vital signs and the nursing notes.  Pertinent labs & imaging results that were available during my care of the patient were reviewed by me and considered in my medical decision making (see chart for details).     Pt with severe lower back pain, worsening over 3 wks. Having spasms in left lower back and buttock as well. Pt unable to move in the stretcher due to pain, intermittently hollering in pain. WIll check chem8 to check electrolytes. Will try valium, dilaudid, decadron here and reasess. No red flags to suggest cauda equina.   8:10 AM I rechecked patient, he is only minimally improved.  He still unable to move.  He still having spasms.  He received 5 mg of Valium p.o., 0.5 mg of Dilaudid, 10 mg of Decadron.  I will try some Toradol.  Waiting on Chem-8 to come back.  Chem-8 shows chronic hyponatremia, also slightly elevated BUN and elevated BUN to creatinine ratio.  I will give him 1 L of IV fluids to see if that will improve his cramping.   Patient feels better, he was able to walk to the bathroom and back.  I will discharge him home.  I will switch his muscle relaxant Valium, and will give him some more Vicodin's.  I did check the controlled substance  database, and his only prescription was from 2 days ago.  I will have him follow-up with family doctor closely next week or with orthopedist. We discussed strict return precautions.   Vitals:   04/05/18 0610 04/05/18 0832 04/05/18 1033  BP:  118/78 (!) 144/92  Pulse:  68 72  Resp:  18   Temp:   98.6 F (37 C)  TempSrc:   Oral  SpO2:  98% 98%  Weight: 68 kg    Height: 5\' 8"  (1.727 m)       Final Clinical Impressions(s) / ED Diagnoses   Final diagnoses:  Lumbar radiculopathy    ED Discharge Orders         Ordered    predniSONE (STERAPRED UNI-PAK 21 TAB) 10 MG (21) TBPK tablet  Daily     04/05/18 1006    diazepam (VALIUM) 5 MG tablet  Every 8 hours PRN     04/05/18 1006    HYDROcodone-acetaminophen (NORCO/VICODIN) 5-325 MG tablet  Every 4 hours PRN     04/05/18 1006           Jeannett Senior, PA-C 04/05/18 [place], [place], MD 04/06/18 [phone]

## 2018-04-05 NOTE — Discharge Instructions (Addendum)
Start taking Valium for spasms as prescribed.  Continue hydrocodone for severe pain.  Take prednisone as prescribed until all gone. Follow up with orthopedics specialist as soon as able. Return if worsening symptoms.

## 2018-04-05 NOTE — ED Notes (Signed)
Pt care assumed at this time. PT is lying in bed speaking with providers at this time. NAD noted. RN will continue to monitor pt at this time.

## 2018-04-05 NOTE — ED Triage Notes (Signed)
Pt brought by [facility] due to leg pain/spasms. Pt is requesting morphine to EMS and upon arrival. Per EMS, pt states his back has improved, however leg pain/spasms has worsened. Per EMS, pt took a Vicodin at 0200.

## 2018-04-13 DIAGNOSIS — M545 Low back pain: Secondary | ICD-10-CM | POA: Diagnosis not present

## 2018-04-16 DIAGNOSIS — M545 Low back pain: Secondary | ICD-10-CM | POA: Diagnosis not present

## 2018-04-21 DIAGNOSIS — M545 Low back pain: Secondary | ICD-10-CM | POA: Diagnosis not present

## 2018-04-28 DIAGNOSIS — M545 Low back pain: Secondary | ICD-10-CM | POA: Diagnosis not present

## 2018-04-28 DIAGNOSIS — M5126 Other intervertebral disc displacement, lumbar region: Secondary | ICD-10-CM | POA: Diagnosis not present

## 2018-05-05 ENCOUNTER — Other Ambulatory Visit: Payer: Self-pay | Admitting: Neurosurgery

## 2018-05-05 ENCOUNTER — Other Ambulatory Visit: Payer: Self-pay

## 2018-05-05 ENCOUNTER — Encounter (HOSPITAL_COMMUNITY): Payer: Self-pay | Admitting: *Deleted

## 2018-05-05 DIAGNOSIS — M5126 Other intervertebral disc displacement, lumbar region: Secondary | ICD-10-CM | POA: Diagnosis not present

## 2018-05-05 NOTE — H&P (Signed)
Chief Complaint   Back pain  HPI   HPI: Ethan Santana is a 67 y.o. male  with left greater than right leg pain and low back pain.  Symptoms started several weeks ago when he bent forward while sitting to pick up a lab time.  He felt as though he pulled a muscle in his back and developed low back pain.  While low back pain was gradually improving he all of a sudden developed excruciating left lower extremity pain and mild right lower extremity pain.  Pain affects his bilateral hips and radiates down into his bilateral thighs.  He endorses an electric type sensation radiating from his left hip down to his left foot posterolaterally.  He endorses numbness in his right thigh.  Pain has been severe.  He has been taking multiple medications without relief.  He has difficulties with ambulation due to the significant pain.  He denies bowel or bladder dysfunction. An MRI of his lumbar spine was ordered revealing a large L1-L2  HNP.  He presents today for surgery.  He is without any concerns.  Patient Active Problem List   Diagnosis Date Noted  . Seizure disorder ([place]) 11/05/2017  . Therapeutic drug monitoring 11/05/2017  . Snoring 02/07/2016  . Refill clinic medication management patient 01/11/2015  . Diplopia 01/11/2015  . Recurrent seizures ([place]) 12/12/2012    PMH: Past Medical History:  Diagnosis Date  . Chronic urticaria   . Diplopia 01/11/2015  . HBP (high blood pressure)   . Migraine   . Refill clinic medication management patient 01/11/2015  . Seizure ([place])     PSH: Past Surgical History:  Procedure Laterality Date  . TONSILLECTOMY      No medications prior to admission.    SH: Social History   Tobacco Use  . Smoking status: Never Smoker  . Smokeless tobacco: Never Used  Substance Use Topics  . Alcohol use: No  . Drug use: No    MEDS: Prior to Admission medications   Medication Sig Start Date End Date Taking? Authorizing Provider  amphetamine-dextroamphetamine  (ADDERALL) 20 MG tablet Take 20 mg by mouth daily as needed (adhd).  10/16/12  Yes [provider]  aspirin-acetaminophen-caffeine (EXCEDRIN MIGRAINE) [phone] MG tablet Take 1 tablet by mouth daily as needed for headache or migraine.   Yes [provider]  calcium carbonate (TUMS EX) 750 MG chewable tablet Chew 1-2 tablets by mouth See admin instructions. 1 tab in the morning, 2 tabs at dinner   Yes [provider]  carbamazepine (TEGRETOL) 200 MG tablet TAKE 1/2 TABLET IN THE MORNING AND 1 TABLET IN THE EVENING. Patient taking differently: Take 100-200 mg by mouth See admin instructions. TAKE 1/2 TABLET IN THE MORNING AND 1 TABLET IN THE EVENING. 03/04/18  Yes Dennie Bible, NP  Cholecalciferol (VITAMIN D) 2000 UNITS CAPS Take 2,000 Units by mouth daily.   Yes [provider]  diazepam (VALIUM) 5 MG tablet Take 1 tablet (5 mg total) by mouth every 8 (eight) hours as needed for anxiety or muscle spasms. Patient taking differently: Take 5 mg by mouth every 6 (six) hours as needed for anxiety or muscle spasms.  04/05/18  Yes Kirichenko, Tatyana, PA-C  famotidine (PEPCID) 20 MG tablet Take 20 mg by mouth daily.   Yes [provider]  fluticasone (FLONASE) 50 MCG/ACT nasal spray Place 1 spray into both nostrils daily.  11/30/12  Yes [provider]  gabapentin (NEURONTIN) 300 MG capsule Take 300 mg  by mouth 4 (four) times daily.   Yes [provider]  HYDROcodone-acetaminophen (NORCO/VICODIN) 5-325 MG tablet Take 2 tablets by mouth every 4 (four) hours as needed. Patient taking differently: Take 1 tablet by mouth 2 (two) times daily as needed (pain).  04/05/18  Yes Kirichenko, Tatyana, PA-C  ibuprofen (ADVIL,MOTRIN) 200 MG tablet Take 200-600 mg by mouth every 8 (eight) hours as needed (severe pain).    Yes [provider]  Multiple Vitamins-Minerals (CENTRUM SILVER PO) Take 1 tablet by mouth daily.   Yes [provider]  nabumetone (RELAFEN) 500 MG tablet Take 500 mg by mouth 2 (two) times daily as needed (Back pain).   Yes [provider]  nadolol (CORGARD) 40 MG tablet Take 40 mg by mouth daily.  09/09/17  Yes [provider]  omeprazole (PRILOSEC) 20 MG capsule Take 20 mg by mouth daily as needed (indigestion).    Yes [provider]  pseudoephedrine (SUDAFED) 30 MG tablet Take 30 mg by mouth daily as needed for congestion.   Yes [provider]  psyllium (REGULOID) 0.52 G capsule Take 0.52 g by mouth 4 (four) times daily.   Yes [provider]  sodium chloride (OCEAN) 0.65 % SOLN nasal spray Place 1 spray into both nostrils as needed for congestion.   Yes [provider]  lidocaine (LIDODERM) 5 % Place 1 patch onto the skin daily. Remove & Discard patch within 12 hours or as directed by MD Patient not taking: Reported on 05/05/2018 04/03/18   Lorayne Bender, PA-C    ALLERGY: No Known Allergies  Social History   Tobacco Use  . Smoking status: Never Smoker  . Smokeless tobacco: Never Used  Substance Use Topics  . Alcohol use: No     Family History  Problem Relation Age of Onset  . Stroke Mother   . Cancer Mother   . Bone cancer Father      ROS   ROS  Exam   There were no vitals filed for this visit. General appearance: WDWN, NAD Eyes: No scleral injection Cardiovascular: Regular rate and rhythm without murmurs, rubs, gallops. No edema or variciosities. Distal pulses normal. Pulmonary: Effort normal, non-labored breathing Musculoskeletal:     Muscle tone upper extremities: Normal    Muscle tone lower extremities: Normal    Motor exam: Upper Extremities Deltoid Bicep Tricep Grip  Right 5/5 5/5 5/5 5/5  Left 5/5 5/5 5/5 5/5   Lower Extremity IP Quad PF DF EHL  Right 5/5 5/5 5/5 5/5 5/5  Left 5/5 5/5 5/5 5/5 5/5   Neurological Mental Status:    - Patient is awake, alert, oriented to person, place, month, year, and situation     - Patient is able to give a clear and coherent history.    - No signs of aphasia or neglect Cranial Nerves    - II: Visual Fields are full. PERRL    - III/IV/VI: EOMI without ptosis or diploplia.     - V: Facial sensation is grossly normal    - VII: Facial movement is symmetric.     - VIII: hearing is intact to voice    - X: Uvula elevates symmetrically    - XI: Shoulder shrug is symmetric.    - XII: tongue is midline without atrophy or fasciculations.  Sensory: Sensation grossly intact to LT Deep Tendon Reflexes    - 2+ and symmetric in the biceps and patellae.    Results - Imaging/Labs  No results found for this or any previous visit (from the past 48 hour(s)).  No results found.  IMAGING:   MRI of lumbar spine was reviewed.  There is a large soft disc extrusion at L1-2 severely compressing the thecal sac slightly asymmetric to the right.  Impression/Plan   67 y.o. male   With left greater than right lower extremity pain secondary to large soft disc extrusion at L1-2.  We will plan on proceeding with microdiskectomy for decompression.    While in the office risks, benefits and alternatives to procedure were discussed.  Patient stated an own language understanding and wished to proceed.

## 2018-05-05 NOTE — Progress Notes (Signed)
Pt denies SOB, chest pain, and being under the care of a cardiologist. Pt denies having a stress test, echo and cardiac cath. Pt denies having an EKG and chest x ray within the last year. Pt made aware to stop taking  Aspirin, vitamins, fish oil, and herbal medications Sudafed . Do not take any NSAIDs ie: Ibuprofen, Advil, Naproxen (Aleve), Motrin, Excedrin Migraine, Relafen, BC and Goody Powder. Pt requested that spouse, Tammi Klippel, be provided with pre-op instructions via speaker phone. Both pt and spouse verbalized understanding of all pre-op instructions.

## 2018-05-06 ENCOUNTER — Inpatient Hospital Stay (HOSPITAL_COMMUNITY)
Admission: RE | Admit: 2018-05-06 | Discharge: 2018-05-06 | DRG: 520 | Disposition: A | Discharge status: Home / Self Care | Payer: PPO | Attending: Neurosurgery | Admitting: Neurosurgery

## 2018-05-06 ENCOUNTER — Ambulatory Visit (HOSPITAL_COMMUNITY): Payer: PPO

## 2018-05-06 ENCOUNTER — Other Ambulatory Visit: Payer: Self-pay

## 2018-05-06 ENCOUNTER — Encounter (HOSPITAL_COMMUNITY): Payer: Self-pay | Admitting: Certified Registered"

## 2018-05-06 ENCOUNTER — Ambulatory Visit (HOSPITAL_COMMUNITY): Payer: PPO | Admitting: Certified Registered"

## 2018-05-06 ENCOUNTER — Encounter (HOSPITAL_COMMUNITY)
Admission: RE | Disposition: A | Discharge status: Home / Self Care | Payer: Self-pay | Source: Home / Self Care | Attending: Neurosurgery

## 2018-05-06 DIAGNOSIS — Z419 Encounter for procedure for purposes other than remedying health state, unspecified: Secondary | ICD-10-CM

## 2018-05-06 DIAGNOSIS — M5126 Other intervertebral disc displacement, lumbar region: Secondary | ICD-10-CM | POA: Diagnosis not present

## 2018-05-06 DIAGNOSIS — Z7951 Long term (current) use of inhaled steroids: Secondary | ICD-10-CM

## 2018-05-06 DIAGNOSIS — Z809 Family history of malignant neoplasm, unspecified: Secondary | ICD-10-CM

## 2018-05-06 DIAGNOSIS — K219 Gastro-esophageal reflux disease without esophagitis: Secondary | ICD-10-CM | POA: Diagnosis not present

## 2018-05-06 DIAGNOSIS — I10 Essential (primary) hypertension: Secondary | ICD-10-CM | POA: Diagnosis not present

## 2018-05-06 DIAGNOSIS — G43909 Migraine, unspecified, not intractable, without status migrainosus: Secondary | ICD-10-CM | POA: Diagnosis not present

## 2018-05-06 DIAGNOSIS — M48061 Spinal stenosis, lumbar region without neurogenic claudication: Secondary | ICD-10-CM | POA: Diagnosis not present

## 2018-05-06 DIAGNOSIS — Z981 Arthrodesis status: Secondary | ICD-10-CM | POA: Diagnosis not present

## 2018-05-06 DIAGNOSIS — Z823 Family history of stroke: Secondary | ICD-10-CM

## 2018-05-06 HISTORY — DX: Localization-related (focal) (partial) symptomatic epilepsy and epileptic syndromes with simple partial seizures, not intractable, without status epilepticus: G40.109

## 2018-05-06 HISTORY — DX: Pneumonia, unspecified organism: J18.9

## 2018-05-06 HISTORY — DX: Other intervertebral disc displacement, lumbar region: M51.26

## 2018-05-06 HISTORY — DX: Gastro-esophageal reflux disease without esophagitis: K21.9

## 2018-05-06 HISTORY — DX: Unspecified osteoarthritis, unspecified site: M19.90

## 2018-05-06 HISTORY — PX: LUMBAR MICRODISCECTOMY: SHX99

## 2018-05-06 HISTORY — DX: Presence of spectacles and contact lenses: Z97.3

## 2018-05-06 HISTORY — PX: LUMBAR LAMINECTOMY/DECOMPRESSION MICRODISCECTOMY: SHX5026

## 2018-05-06 HISTORY — DX: Other specified behavioral and emotional disorders with onset usually occurring in childhood and adolescence: F98.8

## 2018-05-06 HISTORY — DX: Allergy status to unspecified drugs, medicaments and biological substances: Z88.9

## 2018-05-06 LAB — CBC
HCT: 46.3 % (ref 39.0–52.0)
Hemoglobin: 16.1 g/dL (ref 13.0–17.0)
MCH: 31.9 pg (ref 26.0–34.0)
MCHC: 34.8 g/dL (ref 30.0–36.0)
MCV: 91.9 fL (ref 80.0–100.0)
Platelets: 175 10*3/uL (ref 150–400)
RBC: 5.04 MIL/uL (ref 4.22–5.81)
RDW: 12.5 % (ref 11.5–15.5)
WBC: 5.1 10*3/uL (ref 4.0–10.5)
nRBC: 0 % (ref 0.0–0.2)

## 2018-05-06 LAB — BASIC METABOLIC PANEL
Anion gap: 13 (ref 5–15)
BUN: 29 mg/dL — ABNORMAL HIGH (ref 8–23)
CO2: 22 mmol/L (ref 22–32)
Calcium: 9 mg/dL (ref 8.9–10.3)
Chloride: 99 mmol/L (ref 98–111)
Creatinine, Ser: 0.95 mg/dL (ref 0.61–1.24)
GFR calc non Af Amer: >60 mL/min (ref >60)
Glucose, Bld: 105 mg/dL — ABNORMAL HIGH (ref 70–99)
Potassium: 4.1 mmol/L (ref 3.5–5.1)
Sodium: 134 mmol/L — ABNORMAL LOW (ref 135–145)

## 2018-05-06 SURGERY — LUMBAR LAMINECTOMY/DECOMPRESSION MICRODISCECTOMY 1 LEVEL
Anesthesia: General

## 2018-05-06 MED ORDER — ACETAMINOPHEN 500 MG PO TABS
1000.0000 mg | ORAL_TABLET | Freq: Four times a day (QID) | ORAL | Status: DC
  Administered 2018-05-06: 1000 mg via ORAL
  Filled 2018-05-06: qty 2

## 2018-05-06 MED ORDER — GABAPENTIN 300 MG PO CAPS
300.0000 mg | ORAL_CAPSULE | Freq: Three times a day (TID) | ORAL | Status: DC
  Administered 2018-05-06: 300 mg via ORAL
  Filled 2018-05-06: qty 1

## 2018-05-06 MED ORDER — SODIUM CHLORIDE 0.9 % IV SOLN
INTRAVENOUS | Status: DC
  Administered 2018-05-06: 14:00:00 via INTRAVENOUS

## 2018-05-06 MED ORDER — HYDROMORPHONE HCL 1 MG/ML IJ SOLN
0.5000 mg | INTRAMUSCULAR | Status: DC | PRN
  Filled 2018-05-06: qty 1

## 2018-05-06 MED ORDER — FENTANYL CITRATE (PF) 250 MCG/5ML IJ SOLN
INTRAMUSCULAR | Status: AC
  Filled 2018-05-06: qty 5

## 2018-05-06 MED ORDER — KETAMINE HCL 10 MG/ML IJ SOLN
INTRAMUSCULAR | Status: DC | PRN
  Administered 2018-05-06 (×2): 25 mg via INTRAVENOUS

## 2018-05-06 MED ORDER — OXYCODONE HCL 5 MG PO TABS: 5.0000 mg | ORAL_TABLET | Freq: Once | ORAL | Status: DC | PRN

## 2018-05-06 MED ORDER — ROCURONIUM BROMIDE 100 MG/10ML IV SOLN
INTRAVENOUS | Status: DC | PRN
  Administered 2018-05-06: 20 mg via INTRAVENOUS
  Administered 2018-05-06: 50 mg via INTRAVENOUS

## 2018-05-06 MED ORDER — DEXAMETHASONE SODIUM PHOSPHATE 10 MG/ML IJ SOLN
INTRAMUSCULAR | Status: AC
  Filled 2018-05-06: qty 1

## 2018-05-06 MED ORDER — SUGAMMADEX SODIUM 200 MG/2ML IV SOLN
INTRAVENOUS | Status: DC | PRN
  Administered 2018-05-06: 200 mg via INTRAVENOUS

## 2018-05-06 MED ORDER — THROMBIN 5000 UNITS EX SOLR
OROMUCOSAL | Status: DC | PRN
  Administered 2018-05-06: 10:00:00 via TOPICAL

## 2018-05-06 MED ORDER — ACETAMINOPHEN 325 MG PO TABS: 650.0000 mg | ORAL_TABLET | ORAL | Status: DC | PRN

## 2018-05-06 MED ORDER — OXYCODONE HCL 5 MG/5ML PO SOLN: 5.0000 mg | Freq: Once | ORAL | Status: DC | PRN

## 2018-05-06 MED ORDER — 0.9 % SODIUM CHLORIDE (POUR BTL) OPTIME
TOPICAL | Status: DC | PRN
  Administered 2018-05-06: 1000 mL

## 2018-05-06 MED ORDER — DEXMEDETOMIDINE HCL 200 MCG/2ML IV SOLN
INTRAVENOUS | Status: DC | PRN
  Administered 2018-05-06: 4 ug via INTRAVENOUS

## 2018-05-06 MED ORDER — SODIUM CHLORIDE 0.9 % IV SOLN
INTRAVENOUS | Status: DC | PRN
  Administered 2018-05-06: 10:00:00

## 2018-05-06 MED ORDER — EPHEDRINE SULFATE-NACL 50-0.9 MG/10ML-% IV SOSY
PREFILLED_SYRINGE | INTRAVENOUS | Status: DC | PRN
  Administered 2018-05-06 (×5): 10 mg via INTRAVENOUS

## 2018-05-06 MED ORDER — METHOCARBAMOL 750 MG PO TABS: 750.0000 mg | ORAL_TABLET | Freq: Three times a day (TID) | ORAL | 1 refills | Status: DC | PRN

## 2018-05-06 MED ORDER — CHLORHEXIDINE GLUCONATE CLOTH 2 % EX PADS: 6.0000 | MEDICATED_PAD | Freq: Once | CUTANEOUS | Status: DC

## 2018-05-06 MED ORDER — ONDANSETRON HCL 4 MG/2ML IJ SOLN
INTRAMUSCULAR | Status: DC | PRN
  Administered 2018-05-06: 4 mg via INTRAVENOUS

## 2018-05-06 MED ORDER — MIDAZOLAM HCL 2 MG/2ML IJ SOLN
INTRAMUSCULAR | Status: AC
  Filled 2018-05-06: qty 2

## 2018-05-06 MED ORDER — ROCURONIUM BROMIDE 50 MG/5ML IV SOSY
PREFILLED_SYRINGE | INTRAVENOUS | Status: AC
  Filled 2018-05-06: qty 5

## 2018-05-06 MED ORDER — ONDANSETRON HCL 4 MG/2ML IJ SOLN: 4.0000 mg | Freq: Four times a day (QID) | INTRAMUSCULAR | Status: DC | PRN

## 2018-05-06 MED ORDER — SENNOSIDES-DOCUSATE SODIUM 8.6-50 MG PO TABS: 1.0000 | ORAL_TABLET | Freq: Every evening | ORAL | Status: DC | PRN

## 2018-05-06 MED ORDER — THROMBIN 5000 UNITS EX SOLR
CUTANEOUS | Status: AC
  Filled 2018-05-06: qty 5000

## 2018-05-06 MED ORDER — MIDAZOLAM HCL 5 MG/5ML IJ SOLN
INTRAMUSCULAR | Status: DC | PRN
  Administered 2018-05-06 (×2): 1 mg via INTRAVENOUS

## 2018-05-06 MED ORDER — BUPIVACAINE HCL (PF) 0.5 % IJ SOLN
INTRAMUSCULAR | Status: AC
  Filled 2018-05-06: qty 30

## 2018-05-06 MED ORDER — BUPIVACAINE HCL (PF) 0.5 % IJ SOLN
INTRAMUSCULAR | Status: DC | PRN
  Administered 2018-05-06: 10 mL

## 2018-05-06 MED ORDER — PHENYLEPHRINE 40 MCG/ML (10ML) SYRINGE FOR IV PUSH (FOR BLOOD PRESSURE SUPPORT)
PREFILLED_SYRINGE | INTRAVENOUS | Status: DC | PRN
  Administered 2018-05-06 (×5): 40 ug via INTRAVENOUS

## 2018-05-06 MED ORDER — METHYLPREDNISOLONE ACETATE 80 MG/ML IJ SUSP
INTRAMUSCULAR | Status: AC
  Filled 2018-05-06: qty 1

## 2018-05-06 MED ORDER — METHOCARBAMOL 1000 MG/10ML IJ SOLN
500.0000 mg | Freq: Four times a day (QID) | INTRAVENOUS | Status: DC | PRN
  Filled 2018-05-06: qty 5

## 2018-05-06 MED ORDER — METHYLPREDNISOLONE ACETATE 80 MG/ML IJ SUSP
INTRAMUSCULAR | Status: DC | PRN
  Administered 2018-05-06: 80 mg

## 2018-05-06 MED ORDER — LIDOCAINE-EPINEPHRINE 1 %-1:100000 IJ SOLN
INTRAMUSCULAR | Status: AC
  Filled 2018-05-06: qty 1

## 2018-05-06 MED ORDER — METHOCARBAMOL 500 MG PO TABS
500.0000 mg | ORAL_TABLET | Freq: Four times a day (QID) | ORAL | Status: DC | PRN
  Administered 2018-05-06: 500 mg via ORAL
  Filled 2018-05-06: qty 1

## 2018-05-06 MED ORDER — ZOLPIDEM TARTRATE 5 MG PO TABS: 5.0000 mg | ORAL_TABLET | Freq: Every evening | ORAL | Status: DC | PRN

## 2018-05-06 MED ORDER — PHENOL 1.4 % MT LIQD: 1.0000 | OROMUCOSAL | Status: DC | PRN

## 2018-05-06 MED ORDER — SENNA 8.6 MG PO TABS
1.0000 | ORAL_TABLET | Freq: Two times a day (BID) | ORAL | Status: DC
  Administered 2018-05-06: 8.6 mg via ORAL
  Filled 2018-05-06: qty 1

## 2018-05-06 MED ORDER — DOCUSATE SODIUM 100 MG PO CAPS
100.0000 mg | ORAL_CAPSULE | Freq: Two times a day (BID) | ORAL | Status: DC
  Administered 2018-05-06: 100 mg via ORAL
  Filled 2018-05-06: qty 1

## 2018-05-06 MED ORDER — OXYCODONE-ACETAMINOPHEN 5-325 MG PO TABS: 1.0000 | ORAL_TABLET | ORAL | 0 refills | Status: DC | PRN

## 2018-05-06 MED ORDER — SODIUM CHLORIDE 0.9 % IV SOLN: 250.0000 mL | INTRAVENOUS | Status: DC

## 2018-05-06 MED ORDER — FENTANYL CITRATE (PF) 100 MCG/2ML IJ SOLN
INTRAMUSCULAR | Status: DC | PRN
  Administered 2018-05-06: 100 ug via INTRAVENOUS
  Administered 2018-05-06 (×3): 50 ug via INTRAVENOUS

## 2018-05-06 MED ORDER — LIDOCAINE 2% (20 MG/ML) 5 ML SYRINGE
INTRAMUSCULAR | Status: AC
  Filled 2018-05-06: qty 5

## 2018-05-06 MED ORDER — SODIUM CHLORIDE 0.9% FLUSH: 3.0000 mL | INTRAVENOUS | Status: DC | PRN

## 2018-05-06 MED ORDER — CEFAZOLIN SODIUM-DEXTROSE 2-4 GM/100ML-% IV SOLN
2.0000 g | INTRAVENOUS | Status: AC
  Administered 2018-05-06: 2 g via INTRAVENOUS
  Filled 2018-05-06: qty 100

## 2018-05-06 MED ORDER — LIDOCAINE 2% (20 MG/ML) 5 ML SYRINGE
INTRAMUSCULAR | Status: DC | PRN
  Administered 2018-05-06: 80 mg via INTRAVENOUS

## 2018-05-06 MED ORDER — ACETAMINOPHEN 650 MG RE SUPP: 650.0000 mg | RECTAL | Status: DC | PRN

## 2018-05-06 MED ORDER — OXYCODONE HCL 5 MG PO TABS
5.0000 mg | ORAL_TABLET | ORAL | Status: DC | PRN
  Administered 2018-05-06: 5 mg via ORAL
  Filled 2018-05-06: qty 1

## 2018-05-06 MED ORDER — SODIUM CHLORIDE 0.9% FLUSH
3.0000 mL | Freq: Two times a day (BID) | INTRAVENOUS | Status: DC
  Administered 2018-05-06: 3 mL via INTRAVENOUS

## 2018-05-06 MED ORDER — ONDANSETRON HCL 4 MG PO TABS: 4.0000 mg | ORAL_TABLET | Freq: Four times a day (QID) | ORAL | Status: DC | PRN

## 2018-05-06 MED ORDER — CEFAZOLIN SODIUM-DEXTROSE 2-4 GM/100ML-% IV SOLN
2.0000 g | Freq: Three times a day (TID) | INTRAVENOUS | Status: DC
  Administered 2018-05-06: 2 g via INTRAVENOUS
  Filled 2018-05-06 (×2): qty 100

## 2018-05-06 MED ORDER — BISACODYL 10 MG RE SUPP: 10.0000 mg | Freq: Every day | RECTAL | Status: DC | PRN

## 2018-05-06 MED ORDER — MENTHOL 3 MG MT LOZG: 1.0000 | LOZENGE | OROMUCOSAL | Status: DC | PRN

## 2018-05-06 MED ORDER — PROPOFOL 10 MG/ML IV BOLUS
INTRAVENOUS | Status: AC
  Filled 2018-05-06: qty 20

## 2018-05-06 MED ORDER — ONDANSETRON HCL 4 MG/2ML IJ SOLN: 4.0000 mg | Freq: Once | INTRAMUSCULAR | Status: DC | PRN

## 2018-05-06 MED ORDER — KETAMINE HCL 50 MG/5ML IJ SOSY
PREFILLED_SYRINGE | INTRAMUSCULAR | Status: AC
  Filled 2018-05-06: qty 5

## 2018-05-06 MED ORDER — LACTATED RINGERS IV SOLN
INTRAVENOUS | Status: DC
  Administered 2018-05-06 (×2): via INTRAVENOUS

## 2018-05-06 MED ORDER — FLEET ENEMA 7-19 GM/118ML RE ENEM: 1.0000 | ENEMA | Freq: Once | RECTAL | Status: DC | PRN

## 2018-05-06 MED ORDER — ONDANSETRON HCL 4 MG/2ML IJ SOLN
INTRAMUSCULAR | Status: AC
  Filled 2018-05-06: qty 2

## 2018-05-06 MED ORDER — DEXAMETHASONE SODIUM PHOSPHATE 10 MG/ML IJ SOLN
INTRAMUSCULAR | Status: DC | PRN
  Administered 2018-05-06: 10 mg via INTRAVENOUS

## 2018-05-06 MED ORDER — HYDROMORPHONE HCL 1 MG/ML IJ SOLN: 0.2500 mg | INTRAMUSCULAR | Status: DC | PRN

## 2018-05-06 MED ORDER — HYDROCODONE-ACETAMINOPHEN 5-325 MG PO TABS: 1.0000 | ORAL_TABLET | ORAL | Status: DC | PRN

## 2018-05-06 MED ORDER — LIDOCAINE-EPINEPHRINE 1 %-1:100000 IJ SOLN
INTRAMUSCULAR | Status: DC | PRN
  Administered 2018-05-06: 10 mL

## 2018-05-06 MED ORDER — PROPOFOL 10 MG/ML IV BOLUS
INTRAVENOUS | Status: DC | PRN
  Administered 2018-05-06: 150 mg via INTRAVENOUS

## 2018-05-06 SURGICAL SUPPLY — 62 items
ADH SKN CLS APL DERMABOND .7 (GAUZE/BANDAGES/DRESSINGS) ×1
APL SKNCLS STERI-STRIP NONHPOA (GAUZE/BANDAGES/DRESSINGS)
BAG DECANTER FOR FLEXI CONT (MISCELLANEOUS) ×3 IMPLANT
BENZOIN TINCTURE PRP APPL 2/3 (GAUZE/BANDAGES/DRESSINGS) IMPLANT
BLADE CLIPPER SURG (BLADE) IMPLANT
BLADE SURG 11 STRL SS (BLADE) ×3 IMPLANT
BUR MATCHSTICK NEURO 3.0 LAGG (BURR) IMPLANT
BUR PRECISION FLUTE 5.0 (BURR) IMPLANT
CANISTER SUCT 3000ML PPV (MISCELLANEOUS) ×3 IMPLANT
CARTRIDGE OIL MAESTRO DRILL (MISCELLANEOUS) ×1 IMPLANT
CLOSURE WOUND 1/2 X4 (GAUZE/BANDAGES/DRESSINGS)
COVER WAND RF STERILE (DRAPES) ×3 IMPLANT
DECANTER SPIKE VIAL GLASS SM (MISCELLANEOUS) ×3 IMPLANT
DERMABOND ADVANCED (GAUZE/BANDAGES/DRESSINGS) ×2
DERMABOND ADVANCED .7 DNX12 (GAUZE/BANDAGES/DRESSINGS) ×1 IMPLANT
DIFFUSER DRILL AIR PNEUMATIC (MISCELLANEOUS) ×3 IMPLANT
DRAPE LAPAROTOMY 100X72X124 (DRAPES) ×3 IMPLANT
DRAPE MICROSCOPE LEICA (MISCELLANEOUS) ×3 IMPLANT
DRAPE SURG 17X23 STRL (DRAPES) ×3 IMPLANT
DRSG OPSITE POSTOP 3X4 (GAUZE/BANDAGES/DRESSINGS) ×3 IMPLANT
DRSG OPSITE POSTOP 4X6 (GAUZE/BANDAGES/DRESSINGS) ×2 IMPLANT
DURAPREP 26ML APPLICATOR (WOUND CARE) ×3 IMPLANT
ELECT REM PT RETURN 9FT ADLT (ELECTROSURGICAL) ×3
ELECTRODE REM PT RTRN 9FT ADLT (ELECTROSURGICAL) ×1 IMPLANT
GAUZE 4X4 16PLY RFD (DISPOSABLE) IMPLANT
GAUZE SPONGE 4X4 12PLY STRL (GAUZE/BANDAGES/DRESSINGS) IMPLANT
GLOVE BIO SURGEON STRL SZ7 (GLOVE) ×2 IMPLANT
GLOVE BIOGEL PI IND STRL 7.0 (GLOVE) IMPLANT
GLOVE BIOGEL PI IND STRL 7.5 (GLOVE) ×1 IMPLANT
GLOVE BIOGEL PI INDICATOR 7.0 (GLOVE) ×2
GLOVE BIOGEL PI INDICATOR 7.5 (GLOVE) ×8
GLOVE ECLIPSE 7.0 STRL STRAW (GLOVE) ×3 IMPLANT
GLOVE EXAM NITRILE XL STR (GLOVE) IMPLANT
GOWN STRL REUS W/ TWL LRG LVL3 (GOWN DISPOSABLE) ×2 IMPLANT
GOWN STRL REUS W/ TWL XL LVL3 (GOWN DISPOSABLE) IMPLANT
GOWN STRL REUS W/TWL 2XL LVL3 (GOWN DISPOSABLE) IMPLANT
GOWN STRL REUS W/TWL LRG LVL3 (GOWN DISPOSABLE) ×6
GOWN STRL REUS W/TWL XL LVL3 (GOWN DISPOSABLE) ×3
HEMOSTAT POWDER KIT SURGIFOAM (HEMOSTASIS) ×3 IMPLANT
KIT BASIN OR (CUSTOM PROCEDURE TRAY) ×3 IMPLANT
KIT TURNOVER KIT B (KITS) ×3 IMPLANT
NDL HYPO 18GX1.5 BLUNT FILL (NEEDLE) IMPLANT
NDL SPNL 18GX3.5 QUINCKE PK (NEEDLE) IMPLANT
NEEDLE HYPO 18GX1.5 BLUNT FILL (NEEDLE) ×3 IMPLANT
NEEDLE HYPO 22GX1.5 SAFETY (NEEDLE) ×3 IMPLANT
NEEDLE SPNL 18GX3.5 QUINCKE PK (NEEDLE) IMPLANT
NS IRRIG 1000ML POUR BTL (IV SOLUTION) ×3 IMPLANT
OIL CARTRIDGE MAESTRO DRILL (MISCELLANEOUS) ×3
PACK LAMINECTOMY NEURO (CUSTOM PROCEDURE TRAY) ×3 IMPLANT
PAD ARMBOARD 7.5X6 YLW CONV (MISCELLANEOUS) ×9 IMPLANT
RUBBERBAND STERILE (MISCELLANEOUS) ×6 IMPLANT
SPONGE LAP 4X18 RFD (DISPOSABLE) IMPLANT
SPONGE SURGIFOAM ABS GEL SZ50 (HEMOSTASIS) ×1 IMPLANT
STRIP CLOSURE SKIN 1/2X4 (GAUZE/BANDAGES/DRESSINGS) IMPLANT
SUT VIC AB 0 CT1 18XCR BRD8 (SUTURE) ×1 IMPLANT
SUT VIC AB 0 CT1 8-18 (SUTURE) ×3
SUT VIC AB 2-0 CT1 18 (SUTURE) IMPLANT
SUT VICRYL 3-0 RB1 18 ABS (SUTURE) ×6 IMPLANT
SYR 3ML LL SCALE MARK (SYRINGE) ×2 IMPLANT
TOWEL GREEN STERILE (TOWEL DISPOSABLE) ×3 IMPLANT
TOWEL GREEN STERILE FF (TOWEL DISPOSABLE) ×3 IMPLANT
WATER STERILE IRR 1000ML POUR (IV SOLUTION) ×3 IMPLANT

## 2018-05-06 NOTE — Anesthesia Postprocedure Evaluation (Signed)
Anesthesia Post Note  Patient: Ethan Santana  Procedure(s) Performed: MICRODISCECTOMY LUMBAR ONE- LUMBAR TWO (N/A )     Patient location during evaluation: PACU Anesthesia Type: General Level of consciousness: awake and alert Pain management: pain level controlled Vital Signs Assessment: post-procedure vital signs reviewed and stable Respiratory status: spontaneous breathing, nonlabored ventilation and respiratory function stable Cardiovascular status: blood pressure returned to baseline and stable Postop Assessment: no apparent nausea or vomiting Anesthetic complications: no    Last Vitals:  Vitals:   05/06/18 1248 05/06/18 1531  BP: (!) 140/95 (!) 132/94  Pulse: 77 72  Resp: 14 15  Temp: 36.5 C (!) 36.4 C  SpO2: 94% 97%    Last Pain:  Vitals:   05/06/18 1531  TempSrc: Axillary  PainSc:                  Lidia Collum

## 2018-05-06 NOTE — Progress Notes (Signed)
Pt discharge education and instructions completed with pt and spouse at bedside; both voices understanding and denies any questions. Pt IV removed; back incision dsg remains unremarkable; pt discharge home with spouse to transport him home. Pt handed his prescriptions for Percocet and Robaxin. Pt transported off unit via wheelchair with spouse and belongings to the side including pt DME walker. Delia Heady RN

## 2018-05-06 NOTE — Discharge Summary (Signed)
Physician Discharge Summary  Patient ID: Ethan Santana MRN: [id number] DOB/AGE: 1950-10-06 67 y.o.  Admit date: 05/06/2018 Discharge date: 05/06/2018  Admission Diagnoses:  Lumbar hnp  Discharge Diagnoses:  Same Active Problems:   Lumbar herniated disc   Discharged Condition: Stable  Hospital Course:  Ethan Santana is a 67 y.o. male who was admitted for the below procedure.  He had complete resolution in pre op pain. There were no post operative complications. At time of discharge, pain was well controlled, ambulating with Pt/OT, tolerating po, voiding normal. Ready for discharge.  Treatments: Surgery - L1-2 microdisc  Discharge Exam: Blood pressure (!) 132/94, pulse 72, temperature (!) 97.5 F (36.4 C), temperature source Axillary, resp. rate 15, height 5\' 8"  (1.727 m), weight 68 kg, SpO2 97 %. Awake, alert, oriented Speech fluent, appropriate CN grossly intact 5/5 BUE/BLE Wound c/d/i  Disposition: Discharge disposition: 01-Home or Self Care       Discharge Instructions    Call MD for:  difficulty breathing, headache or visual disturbances   Complete by:  As directed    Call MD for:  persistant dizziness or light-headedness   Complete by:  As directed    Call MD for:  redness, tenderness, or signs of infection (pain, swelling, redness, odor or green/yellow discharge around incision site)   Complete by:  As directed    Call MD for:  severe uncontrolled pain   Complete by:  As directed    Call MD for:  temperature >100.4   Complete by:  As directed    Diet general   Complete by:  As directed    Driving Restrictions   Complete by:  As directed    Do not drive until given clearance.   Increase activity slowly   Complete by:  As directed    Lifting restrictions   Complete by:  As directed    Do not lift anything >10lbs. Avoid bending and twisting in awkward positions. Avoid bending at the back.   May shower / Bathe   Complete by:  As directed    In 24  hours. Okay to wash wound with warm soapy water. Avoid scrubbing the wound. Pat dry.   Remove dressing in 24 hours   Complete by:  As directed      Allergies as of 05/06/2018   No Known Allergies     Medication List    STOP taking these medications   lidocaine 5 % Commonly known as:  LIDODERM     TAKE these medications   amphetamine-dextroamphetamine 20 MG tablet Commonly known as:  ADDERALL Take 20 mg by mouth daily as needed (adhd).   aspirin-acetaminophen-caffeine 250-250-65 MG tablet Commonly known as:  EXCEDRIN MIGRAINE Take 1 tablet by mouth daily as needed for headache or migraine.   calcium carbonate 750 MG chewable tablet Commonly known as:  TUMS EX Chew 1-2 tablets by mouth See admin instructions. 1 tab in the morning, 2 tabs at dinner   carbamazepine 200 MG tablet Commonly known as:  TEGRETOL TAKE 1/2 TABLET IN THE MORNING AND 1 TABLET IN THE EVENING. What changed:    how much to take  how to take this  when to take this   CENTRUM SILVER PO Take 1 tablet by mouth daily.   diazepam 5 MG tablet Commonly known as:  VALIUM Take 1 tablet (5 mg total) by mouth every 8 (eight) hours as needed for anxiety or muscle spasms. What changed:  when to take this   fluticasone  50 MCG/ACT nasal spray Commonly known as:  FLONASE Place 1 spray into both nostrils daily.   gabapentin 300 MG capsule Commonly known as:  NEURONTIN Take 300 mg by mouth 4 (four) times daily.   HYDROcodone-acetaminophen 5-325 MG tablet Commonly known as:  NORCO/VICODIN Take 2 tablets by mouth every 4 (four) hours as needed. What changed:    how much to take  when to take this  reasons to take this   ibuprofen 200 MG tablet Commonly known as:  ADVIL,MOTRIN Take 200-600 mg by mouth every 8 (eight) hours as needed (severe pain).   methocarbamol 750 MG tablet Commonly known as:  ROBAXIN-750 Take 1 tablet (750 mg total) by mouth 3 (three) times daily as needed for muscle spasms.    nabumetone 500 MG tablet Commonly known as:  RELAFEN Take 500 mg by mouth 2 (two) times daily as needed (Back pain).   nadolol 40 MG tablet Commonly known as:  CORGARD Take 40 mg by mouth daily.   omeprazole 20 MG capsule Commonly known as:  PRILOSEC Take 20 mg by mouth daily as needed (indigestion).   oxyCODONE-acetaminophen 5-325 MG tablet Commonly known as:  PERCOCET Take 1 tablet by mouth every 4 (four) hours as needed for severe pain.   PEPCID 20 MG tablet Generic drug:  famotidine Take 20 mg by mouth daily.   pseudoephedrine 30 MG tablet Commonly known as:  SUDAFED Take 30 mg by mouth daily as needed for congestion.   psyllium 0.52 g capsule Commonly known as:  REGULOID Take 0.52 g by mouth 4 (four) times daily.   sodium chloride 0.65 % Soln nasal spray Commonly known as:  OCEAN Place 1 spray into both nostrils as needed for congestion.   Vitamin D 50 MCG (2000 UT) Caps Take 2,000 Units by mouth daily.            Durable Medical Equipment  (From admission, onward)         Start     Ordered   05/06/18 1547  For home use only DME Walker rolling  [facility])  Once    Question:  Patient needs a walker to treat with the following condition  Answer:  Gait instability   05/06/18 1546         Follow-up Information    Lisbeth Renshaw, MD Follow up.   Specialty:  Neurosurgery Contact information: [address] [place] [address] [phone]           Signed: Alyson Ingles 05/06/2018, 3:46 PM

## 2018-05-06 NOTE — Transfer of Care (Signed)
Immediate Anesthesia Transfer of Care Note  Patient: Ethan Santana  Procedure(s) Performed: MICRODISCECTOMY LUMBAR ONE- LUMBAR TWO (N/A )  Patient Location: PACU  Anesthesia Type:General  Level of Consciousness: drowsy  Airway & Oxygen Therapy: Patient Spontanous Breathing and Patient connected to face mask oxygen  Post-op Assessment: Report given to RN and Post -op Vital signs reviewed and stable  Post vital signs: Reviewed and stable  Last Vitals:  Vitals Value Taken Time  BP    Temp    Pulse 67 05/06/2018 11:32 AM  Resp 7 05/06/2018 11:32 AM  SpO2 99 % 05/06/2018 11:32 AM  Vitals shown include unvalidated device data.  Last Pain:  Vitals:   05/06/18 0831  TempSrc:   PainSc: 3          Complications: No apparent anesthesia complications

## 2018-05-06 NOTE — Anesthesia Procedure Notes (Signed)
Procedure Name: Intubation Date/Time: 05/06/2018 10:02 AM Performed by: Gwyndolyn Saxon, CRNA Pre-anesthesia Checklist: Patient identified, Emergency Drugs available, Suction available and Patient being monitored Patient Re-evaluated:Patient Re-evaluated prior to induction Oxygen Delivery Method: Circle system utilized Preoxygenation: Pre-oxygenation with 100% oxygen Induction Type: IV induction Ventilation: Mask ventilation without difficulty and Oral airway inserted - appropriate to patient size Laryngoscope Size: Sabra Heck and 2 Grade View: Grade II Tube type: Oral Tube size: 7.5 mm Number of attempts: 1 Airway Equipment and Method: Patient positioned with wedge pillow and Stylet Placement Confirmation: ETT inserted through vocal cords under direct vision,  positive ETCO2 and breath sounds checked- equal and bilateral Secured at: 21 cm Tube secured with: Tape Dental Injury: Teeth and Oropharynx as per pre-operative assessment

## 2018-05-06 NOTE — Progress Notes (Signed)
Pt assisted out of bed and ambulated to BR to void x1; OOB with brace and walker. Pt RLE noted to briefly buckled whiles walking back to bed. Will continue to closely monitor. Delia Heady RN

## 2018-05-06 NOTE — Anesthesia Preprocedure Evaluation (Addendum)
Anesthesia Evaluation  Patient identified by MRN, date of birth, ID band Patient awake    Reviewed: Allergy & Precautions, NPO status , Patient's Chart, lab work & pertinent test results  History of Anesthesia Complications Negative for: history of anesthetic complications  Airway Mallampati: II  TM Distance: >3 FB Neck ROM: Full    Dental no notable dental hx. (+) Teeth Intact   Pulmonary neg pulmonary ROS,    Pulmonary exam normal        Cardiovascular hypertension, Pt. on medications Normal cardiovascular exam     Neuro/Psych  Headaches, Seizures - (last seizure many years ago), Well Controlled,  negative psych ROS   GI/Hepatic Neg liver ROS, GERD  ,  Endo/Other  negative endocrine ROS  Renal/GU negative Renal ROS  negative genitourinary   Musculoskeletal negative musculoskeletal ROS (+)   Abdominal   Peds  Hematology negative hematology ROS (+)   Anesthesia Other Findings 67 yo M for L1-2 microdiscectomy - seizure d/o, migraines, HTN, GERD - Hgb 16.3  Reproductive/Obstetrics                            Anesthesia Physical Anesthesia Plan  ASA: III  Anesthesia Plan: General   Post-op Pain Management:    Induction: Intravenous  PONV Risk Score and Plan: 2 and Ondansetron, Dexamethasone, Midazolam and Treatment may vary due to age or medical condition  Airway Management Planned: Oral ETT  Additional Equipment: None  Intra-op Plan:   Post-operative Plan: Extubation in OR  Informed Consent: I have reviewed the patients History and Physical, chart, labs and discussed the procedure including the risks, benefits and alternatives for the proposed anesthesia with the patient or authorized representative who has indicated his/her understanding and acceptance.   Dental advisory given  Plan Discussed with:   Anesthesia Plan Comments:        Anesthesia Quick Evaluation

## 2018-05-06 NOTE — Care Management Note (Signed)
Case Management Note  Patient Details  Name: Ethan Santana MRN: [id number] Date of Birth: 1950/07/31  Subjective/Objective:   Pt is s/p: Right L1-2 laminotomy and microdiscectomy for decompression of nerve root. He is from home with spouse.                 Action/Plan: Pt discharging home with self care. Pt with orders for walker. CM notified Jeneen Rinks with [facility] DME and he will deliver the equipment to the room. Pt has transportation home.  Expected Discharge Date:  05/06/18               Expected Discharge Plan:  Home/Self Care  In-House Referral:     Discharge planning Services  CM Consult  Post Acute Care Choice:  Durable Medical Equipment Choice offered to:  Patient  DME Arranged:  Gilford Rile rolling DME Agency:  [place]:    [place]:     Status of Service:  Completed, signed off  If discussed at [place] of Stay Meetings, dates discussed:    Additional Comments:  Pollie Friar, RN 05/06/2018, 4:14 PM

## 2018-05-06 NOTE — Evaluation (Signed)
Physical Therapy Evaluation Patient Details Name: Ethan Santana MRN: [id number] DOB: 20-Jun-1950 Today's Date: 05/06/2018   History of Present Illness  Patient is a 67 y/o male admitted with larger L1-L2 HNP now  s/p: Right L1-2 laminotomy and microdiscectomy for decompression of nerve root.  PMH positive for seizure disorder, migraine, diplopia.   Clinical Impression  Patient presents with decreased independence with mobility due to decreased awareness of precautions and recommendations for back rehab.  Able to demonstrate adherence to precautions during session today and wife present to reinforce precautions at home.  No further skilled PT needs at this time.      Follow Up Recommendations No PT follow up    Equipment Recommendations  Rolling walker with 5" wheels    Recommendations for Other Services       Precautions / Restrictions Precautions Precautions: Back Precaution Booklet Issued: Yes (comment) Required Braces or Orthoses: Spinal Brace Spinal Brace: Lumbar corset;Applied in sitting position Restrictions Weight Bearing Restrictions: No      Mobility  Bed Mobility Overal bed mobility: Needs Assistance Bed Mobility: Rolling;Sidelying to Sit Rolling: Supervision Sidelying to sit: Supervision       General bed mobility comments: cues for technique, assist for safety  Transfers Overall transfer level: Needs assistance Equipment used: Rolling walker (2 wheeled) Transfers: Sit to/from Stand Sit to Stand: Min guard         General transfer comment: for safety; discussed and demonstrated car transfers  Ambulation/Gait Ambulation/Gait assistance: Min assist;Supervision Gait Distance (Feet): 200 Feet Assistive device: Rolling walker (2 wheeled) Gait Pattern/deviations: Step-through pattern;Decreased stride length     General Gait Details: no noted antalgia  Stairs Stairs: Yes Stairs assistance: Supervision Stair Management: One rail Right;Sideways;Step  to pattern Number of Stairs: 2(x4) General stair comments: cues for step to pattern  Wheelchair Mobility    Modified Rankin (Stroke Patients Only)       Balance Overall balance assessment: Needs assistance   Sitting balance-Leahy Scale: Good       Standing balance-Leahy Scale: Fair                               Pertinent Vitals/Pain Pain Assessment: No/denies pain    Home Living Family/patient expects to be discharged to:: Private residence Living Arrangements: Spouse/significant other Available Help at Discharge: Family;Available 24 hours/day Type of Home: House Home Access: Stairs to enter Entrance Stairs-Rails: Doctor, general practice of Steps: 1 at back 4 at front Home Layout: Two level;Bed/bath upstairs Home Equipment: None      Prior Function Level of Independence: Independent         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     Hand Dominance   Dominant Hand: Right    Extremity/Trunk Assessment   Upper Extremity Assessment Upper Extremity Assessment: Overall WFL for tasks assessed    Lower Extremity Assessment Lower Extremity Assessment: Generalized weakness       Communication   Communication: No difficulties  Cognition Arousal/Alertness: Awake/alert Behavior During Therapy: WFL for tasks assessed/performed Overall Cognitive Status: Within Functional Limits for tasks assessed                                        General Comments General comments (skin integrity, edema, etc.): wife present and heard all precautions, activity progression and modifications to ADL, they refused 3:1 for shower, but  advised without brace to be careful with no bending or twisting    Exercises     Assessment/Plan    PT Assessment Patent does not need any further PT services  PT Problem List         PT Treatment Interventions      PT Goals (Current goals can be found in the Care Plan section)  Acute Rehab PT  Goals PT Goal Formulation: All assessment and education complete, DC therapy    Frequency     Barriers to discharge        Co-evaluation               AM-PAC PT "6 Clicks" Mobility  Outcome Measure Help needed turning from your back to your side while in a flat bed without using bedrails?: A Little Help needed moving from lying on your back to sitting on the side of a flat bed without using bedrails?: A Little Help needed moving to and from a bed to a chair (including a wheelchair)?: A Little Help needed standing up from a chair using your arms (e.g., wheelchair or bedside chair)?: A Little Help needed to walk in hospital room?: A Little Help needed climbing 3-5 steps with a railing? : A Little 6 Click Score: 18    End of Session Equipment Utilized During Treatment: Back brace Activity Tolerance: Patient tolerated treatment well Patient left: in chair;with family/visitor present;with call bell/phone within reach   PT Visit Diagnosis: Difficulty in walking, not elsewhere classified (R26.2)    Time: 8657-8469 PT Time Calculation (min) (ACUTE ONLY): 30 min   Charges:   PT Evaluation $PT Eval Low Complexity: 1 Low PT Treatments $Gait Training: 8-22 mins        Sheran Lawless, PT Acute Rehabilitation Services [phone] 05/06/2018   Ethan Santana 05/06/2018, 4:47 PM

## 2018-05-06 NOTE — Progress Notes (Signed)
Pt admitted to the unit from pacu; pt A&O x3; MAE x4; pt oriented to the unit and room; fall/safety precaution and prevention education completed with pt and he voices understanding. Pt due to void per report; skin clean and intact with no pressure ulcers or opened wounds noted except for back incision dsg with HC intact and no drainage or stain noted. VSS; pt in bed with call light within reach and spouse at bedside. Pt back brace delivered to pt in room. Will continue to closely monitor; bed alarm on. Delia Heady RN   05/06/18 1248  Vitals  Temp 97.7 F (36.5 C)  Temp Source Oral  BP (!) 140/95  BP Location Right Arm  BP Method Automatic  Patient Position (if appropriate) Lying  Pulse Rate 77  Pulse Rate Source Dinamap  Resp 14  Oxygen Therapy  SpO2 94 %  O2 Device Room Air

## 2018-05-06 NOTE — Op Note (Signed)
  NEUROSURGERY OPERATIVE NOTE   PREOP DIAGNOSIS: Lumbar disc herniation, L1-2  POSTOP DIAGNOSIS: Same  PROCEDURE: 1. Right L1-2 laminotomy and microdiscectomy for decompression of nerve root 2. Use of operating microscope  SURGEON: Dr. Consuella Lose, MD  ASSISTANT: Ferne Reus, PA-C  ANESTHESIA: General Endotracheal  EBL: 50cc  SPECIMENS: None  DRAINS: None  COMPLICATIONS: None immediate  CONDITION: Hemodynamically stable to PACU  HISTORY: Ethan Santana is a 67 y.o. male initially seen in the outpatient neurosurgery clinic yesterday with severe low back pain and bilateral left greater than right hip and anterior thigh pain.  He had MRI which demonstrated very large right eccentric L1 to disc herniation with severe bilateral stenosis.  He was unable to walk because of pain.  Surgical decompression was therefore indicated.  Risks and benefits of the surgery were reviewed in detail with the patient and his family.  After all questions were answered informed consent was obtained and witnessed.  PROCEDURE IN DETAIL: The patient was brought to the operating room via stretcher. After induction of general anesthesia, the patient was positioned on the operative table in the prone position with all pressure points meticulously padded. The skin of the low back was then prepped and draped in the usual sterile fashion.  Under x-ray, spinal needle was introduced and found to be located at the level of the L1 pedicle.  The skin was infiltrated with local anesthetic. Skin incision was then made sharply and Bovie electrocautery was used to dissect the subcutaneous tissue until the lumbodorsal fascia was identified. The fascia was then incised using Bovie electrocautery and the lamina at the L1 and L2 levels was identified and dissection was carried out in the subperiosteal plane. Self-retaining retractor was then placed, and intraoperative x-ray was taken with a Technical brewer in the  L1-2 interspace to confirm we were at the correct level.  Using a high-speed drill, laminotomy was completed with a partial medial facetectomy. The ligamentum flavum was then identified and removed and the lateral edge of the thecal sac was identified. This was then traced down to identify the traversing nerve root.  The lateral edge of the thecal sac was noted to be indented medially, and the entire thecal sac was noted to be displaced dorsally.  Penfield dissector and coronary dilator were used to dissect in the ventral epidural space.  I found a very large free fragment at the level of the L1-2 disc space, slightly inferiorly migrated.  Multiple large free fragments were then removed.  Once this was done, I was able to freely pass a long ball-tipped dissector in the ventral epidural space as well as underneath the right L2 nerve root and out the foramen indicating good decompression.  Hemostasis was then secured using a combination of morcellized Gelfoam and thrombin and bipolar electrocautery. The wound is irrigated with copious amounts of antibiotic saline irrigation. The nerve root was then covered with a long-acting steroid solution. Self-retaining retractor was then removed, and the wound is closed in layers using a combination of interrupted 0 Vicryl and 3-0 Vicryl stitches. The skin was closed using standard skin glue.  At the end of the case all sponge, needle, and instrument and cottonoid counts were correct. The patient was then transferred to the stretcher, extubated, and taken to the postanesthesia care unit in stable hemodynamic condition.

## 2018-05-07 ENCOUNTER — Encounter (HOSPITAL_COMMUNITY): Payer: Self-pay | Admitting: Neurosurgery

## 2018-05-09 ENCOUNTER — Other Ambulatory Visit: Payer: Self-pay | Admitting: Neurosurgery

## 2018-06-05 DIAGNOSIS — Z125 Encounter for screening for malignant neoplasm of prostate: Secondary | ICD-10-CM | POA: Diagnosis not present

## 2018-06-05 DIAGNOSIS — R7301 Impaired fasting glucose: Secondary | ICD-10-CM | POA: Diagnosis not present

## 2018-06-05 DIAGNOSIS — I10 Essential (primary) hypertension: Secondary | ICD-10-CM | POA: Diagnosis not present

## 2018-06-11 DIAGNOSIS — F900 Attention-deficit hyperactivity disorder, predominantly inattentive type: Secondary | ICD-10-CM | POA: Diagnosis not present

## 2018-06-11 DIAGNOSIS — Z23 Encounter for immunization: Secondary | ICD-10-CM | POA: Diagnosis not present

## 2018-06-11 DIAGNOSIS — R946 Abnormal results of thyroid function studies: Secondary | ICD-10-CM | POA: Diagnosis not present

## 2018-06-11 DIAGNOSIS — M48061 Spinal stenosis, lumbar region without neurogenic claudication: Secondary | ICD-10-CM | POA: Diagnosis not present

## 2018-06-11 DIAGNOSIS — Z87898 Personal history of other specified conditions: Secondary | ICD-10-CM | POA: Diagnosis not present

## 2018-06-11 DIAGNOSIS — Z125 Encounter for screening for malignant neoplasm of prostate: Secondary | ICD-10-CM | POA: Diagnosis not present

## 2018-06-11 DIAGNOSIS — I10 Essential (primary) hypertension: Secondary | ICD-10-CM | POA: Diagnosis not present

## 2018-06-11 DIAGNOSIS — R7301 Impaired fasting glucose: Secondary | ICD-10-CM | POA: Diagnosis not present

## 2018-06-11 DIAGNOSIS — Z0000 Encounter for general adult medical examination without abnormal findings: Secondary | ICD-10-CM | POA: Diagnosis not present

## 2018-06-11 DIAGNOSIS — Z8669 Personal history of other diseases of the nervous system and sense organs: Secondary | ICD-10-CM | POA: Diagnosis not present

## 2018-06-25 DIAGNOSIS — Z0000 Encounter for general adult medical examination without abnormal findings: Secondary | ICD-10-CM | POA: Diagnosis not present

## 2018-06-25 DIAGNOSIS — Z1389 Encounter for screening for other disorder: Secondary | ICD-10-CM | POA: Diagnosis not present

## 2018-08-14 ENCOUNTER — Encounter: Payer: Self-pay | Admitting: Neurology

## 2018-08-14 ENCOUNTER — Telehealth: Payer: Self-pay | Admitting: Neurology

## 2018-08-14 NOTE — Telephone Encounter (Signed)
Called and reviewed the chart with the patient. Patient didn't have any questions or concerns in regards to the apt being completed through video. Advised if he did to contact me

## 2018-08-14 NOTE — Addendum Note (Signed)
Addended by: Darleen Crocker on: 08/14/2018 10:52 AM   Modules accepted: Orders

## 2018-08-14 NOTE — Telephone Encounter (Signed)
Due to current COVID 19 pandemic, our office is severely reducing in office visits until further notice, in order to minimize the risk to our patients and healthcare providers.   Called patient, who was on the schedule to see Jinny Blossom, but due to Jinny Blossom being out of office reschedule was needed. Patient agreed to the sooner appointment I offered him with Dr. Brett Fairy, via virtual visit. I explained the steps that need to be taken in order to have the webex meeting on pt's device. I stayed on the phone with pt and helped him download the cisco webex app. Patient verbalized understanding. I advised patient that he will receive an e-mail from [place] with the link needed for connection. Patient understands he will be receiving phone calls from Dr. Edwena Felty nurse to update history as well as office staff to update insurance info if needed.   Pt understands that although there may be some limitations with this type of visit, we will take all precautions to reduce any security or privacy concerns.  Pt understands that this will be treated like an in office visit and we will file with pt's insurance, and there may be a patient responsible charge related to this service.  Pt's email is [email or web address]. Pt understands that the cis[employer] webex software must be downloaded and operational on the device pt plans to use for the visit.

## 2018-08-20 ENCOUNTER — Other Ambulatory Visit: Payer: Self-pay

## 2018-08-20 ENCOUNTER — Ambulatory Visit (INDEPENDENT_AMBULATORY_CARE_PROVIDER_SITE_OTHER): Payer: PPO | Admitting: Neurology

## 2018-08-20 ENCOUNTER — Encounter: Payer: Self-pay | Admitting: Neurology

## 2018-08-20 ENCOUNTER — Other Ambulatory Visit: Payer: Self-pay | Admitting: Neurology

## 2018-08-20 DIAGNOSIS — Z760 Encounter for issue of repeat prescription: Secondary | ICD-10-CM

## 2018-08-20 DIAGNOSIS — R0683 Snoring: Secondary | ICD-10-CM | POA: Diagnosis not present

## 2018-08-20 DIAGNOSIS — H532 Diplopia: Secondary | ICD-10-CM | POA: Diagnosis not present

## 2018-08-20 DIAGNOSIS — G40909 Epilepsy, unspecified, not intractable, without status epilepticus: Secondary | ICD-10-CM | POA: Diagnosis not present

## 2018-08-20 MED ORDER — CARBAMAZEPINE 200 MG PO TABS: 200.0000 mg | ORAL_TABLET | Freq: Three times a day (TID) | ORAL | 3 refills | Status: DC

## 2018-08-20 MED ORDER — CARBAMAZEPINE 200 MG PO TABS: ORAL_TABLET | ORAL | 3 refills | Status: DC

## 2018-08-20 NOTE — Patient Instructions (Signed)
Carbamazepine tablets What is this medicine? CARBAMAZEPINE (kar ba MAZ e peen) is used to control seizures caused by certain types of epilepsy. This medicine is also used to treat nerve related pain. It is not for common aches and pains. This medicine may be used for other purposes; ask your health care provider or pharmacist if you have questions. COMMON BRAND NAME(S): Epitol, Tegretol What should I tell my health care provider before I take this medicine? They need to know if you have any of these conditions: -Asian ancestry -bone marrow disease -glaucoma -heart disease or irregular heartbeat -kidney disease -liver disease -low blood counts, like low white cell, platelet, or red cell counts -porphyria -psychotic disorders -suicidal thoughts, plans, or attempt; a previous suicide attempt by you or a family member -an unusual or allergic reaction to carbamazepine, tricyclic antidepressants, phenytoin, phenobarbital or other medicines, foods, dyes, or preservatives -pregnant or trying to get pregnant -breast-feeding How should I use this medicine? Take this medicine by mouth with a glass of water. Follow the directions on the prescription label. Take this medicine with food. Take your doses at regular intervals. Do not take your medicine more often than directed. Do not stop taking this medicine except on the advice of your doctor or health care professional. A special MedGuide will be given to you by the pharmacist with each prescription and refill. Be sure to read this information carefully each time. Talk to your pediatrician regarding the use of this medicine in children. Special care may be needed. Overdosage: If you think you have taken too much of this medicine contact a poison control center or emergency room at once. NOTE: This medicine is only for you. Do not share this medicine with others. What if I miss a dose? If you miss a dose, take it as soon as you can. If it is almost  time for your next dose, take only that dose. Do not take double or extra doses. What may interact with this medicine? Do not take this medicine with any of the following medications: -certain medicines used to treat HIV infection or AIDS that are given in combination with cobicistat - delavirdine - MAOIs like Carbex, Eldepryl, Marplan, Nardil, and Parnate - nefazodone - oxcarbazepine This medicine may also interact with the following medications: - acetaminophen - acetazolamide - barbiturate medicines for inducing sleep or treating seizures, like phenobarbital - certain antibiotics like clarithromycin, erythromycin or troleandomycin - cimetidine - cyclosporine - danazol - dicumarol - doxycycline - male hormones, including estrogens and birth control pills - grapefruit juice - isoniazid, INH - levothyroxine and other thyroid hormones - lithium and other medicines to treat mood problems or psychotic disturbances - loratadine - medicines for angina or high blood pressure - medicines for cancer - medicines for depression or anxiety - medicines for sleep - medicines to treat fungal infections, like fluconazole, itraconazole or ketoconazole - medicines used to treat HIV infection or AIDS - methadone - niacinamide - praziquantel - propoxyphene - rifampin or rifabutin - seizure or epilepsy medicine - steroid medicines such as prednisone or cortisone - theophylline - tramadol - warfarin This list may not describe all possible interactions. Give your health care provider a list of all the medicines, herbs, non-prescription drugs, or dietary supplements you use. Also tell them if you smoke, drink alcohol, or use illegal drugs. Some items may interact with your medicine. What should I watch for while using this medicine? Visit your doctor or health care professional for a regular check  on your progress. Do not change brands or dosage forms of this  medicine without discussing the change with your doctor or health care professional. If you are taking this medicine for epilepsy (seizures), do not stop taking it suddenly. This increases the risk of seizures. Wear a Probation officer or necklace. Carry an identification card with information about your condition, medications, and doctor or health care professional. Dennis Bast may get drowsy, dizzy, or have blurred vision. Do not drive, use machinery, or do anything that needs mental alertness until you know how this medicine affects you. To reduce dizzy or fainting spells, do not sit or stand up quickly, especially if you are an older patient. Alcohol can increase drowsiness and dizziness. Avoid alcoholic drinks. Birth control pills may not work properly while you are taking this medicine. Talk to your doctor about using an extra method of birth control. This medicine can make you more sensitive to the sun. Keep out of the sun. If you cannot avoid being in the sun, wear protective clothing and use sunscreen. Do not use sun lamps or tanning beds/booths. The use of this medicine may increase the chance of suicidal thoughts or actions. Pay special attention to how you are responding while on this medicine. Any worsening of mood, or thoughts of suicide or dying should be reported to your health care professional right away. Women who become pregnant while using this medicine may enroll in the [place] Pregnancy Registry by calling [phone]. This registry collects information about the safety of antiepileptic drug use during pregnancy. This medicine may cause a decrease in vitamin D and folic acid. You should make sure that you get enough vitamins while you are taking this medicine. Discuss the foods you eat and the vitamins you take with your health care professional. What side effects may I notice from receiving this medicine? Side effects that you should report to your doctor or  health care professional as soon as possible: -allergic reactions like skin rash, itching or hives, swelling of the face, lips, or tongue -breathing problems -changes in vision -confusion -dark urine -fast or irregular heartbeat -fever or chills, sore throat -mouth ulcers -pain or difficulty passing urine -redness, blistering, peeling or loosening of the skin, including inside the mouth -ringing in the ears -seizures -stomach pain -swollen joints or muscle/joint aches and pains -unusual bleeding or bruising -unusually weak or tired -vomiting -worsening of mood, thoughts or actions of suicide or dying -yellowing of the eyes or skin Side effects that usually do not require medical attention (report to your doctor or health care professional if they continue or are bothersome): -clumsiness or unsteadiness -diarrhea or constipation -headache -increased sweating -nausea This list may not describe all possible side effects. Call your doctor for medical advice about side effects. You may report side effects to FDA at 1-800-FDA-1088. Where should I keep my medicine? Keep out of reach of children. Store at room temperature below 30 degrees C (86 degrees F). Keep container tightly closed. Protect from moisture. Throw away any unused medicine after the expiration date. NOTE: This sheet is a summary. It may not cover all possible information. If you have questions about this medicine, talk to your doctor, pharmacist, or health care provider.  2019 Elsevier/Gold Standard (2016-12-31 12:12:58)

## 2018-08-20 NOTE — Progress Notes (Signed)
Virtual Visit via Video Note  I connected with Ethan Santana on 08/20/18 at  1:30 PM EDT by a video enabled telemedicine application and verified that I am speaking with the correct person using two identifiers.   I discussed the limitations of evaluation and management by telemedicine and the availability of in person appointments. The patient expressed understanding and agreed to proceed.  History of Present Illness: seizure free for many years- routine RV - last seizure over 20 years ago, no medication changes, only refills, no side effects.  He gained 13 pounds while at home.     Observations/Objective: he likes to alternate blood draw with his PCP, Dr. Rex Kras- Dr. Rex Kras sees him in February and I would order labs for August 2020- this way he doesn't need testing twice.   Assessment and Plan: no need to refill meds. Tegretol     Follow Up Instructions: I will put a blood draw on the books for future use. Tegretol to be drawn with [place] and alternating with Dr Rex Kras.   CBC and CMET and Tegretol in the next 4-5  month, please.   Stay well !      I discussed the assessment and treatment plan with the patient. The patient was provided an opportunity to ask questions and all were answered. The patient agreed with the plan and demonstrated an understanding of the instructions.   The patient was advised to call back or seek an in-person evaluation if the symptoms worsen or if the condition fails to improve as anticipated.  I provided 15 minutes of non-face-to-face time during this encounter.   Larey Seat, MD

## 2018-09-02 ENCOUNTER — Ambulatory Visit: Payer: PPO | Admitting: Adult Health

## 2018-10-03 DIAGNOSIS — Z8669 Personal history of other diseases of the nervous system and sense organs: Secondary | ICD-10-CM | POA: Diagnosis not present

## 2018-10-03 DIAGNOSIS — Z0000 Encounter for general adult medical examination without abnormal findings: Secondary | ICD-10-CM | POA: Diagnosis not present

## 2018-10-03 DIAGNOSIS — F900 Attention-deficit hyperactivity disorder, predominantly inattentive type: Secondary | ICD-10-CM | POA: Diagnosis not present

## 2018-10-03 DIAGNOSIS — I10 Essential (primary) hypertension: Secondary | ICD-10-CM | POA: Diagnosis not present

## 2018-10-03 DIAGNOSIS — Z125 Encounter for screening for malignant neoplasm of prostate: Secondary | ICD-10-CM | POA: Diagnosis not present

## 2018-10-03 DIAGNOSIS — Z23 Encounter for immunization: Secondary | ICD-10-CM | POA: Diagnosis not present

## 2018-10-03 DIAGNOSIS — R7301 Impaired fasting glucose: Secondary | ICD-10-CM | POA: Diagnosis not present

## 2018-10-03 DIAGNOSIS — R946 Abnormal results of thyroid function studies: Secondary | ICD-10-CM | POA: Diagnosis not present

## 2018-10-03 DIAGNOSIS — M48061 Spinal stenosis, lumbar region without neurogenic claudication: Secondary | ICD-10-CM | POA: Diagnosis not present

## 2018-10-03 DIAGNOSIS — Z87898 Personal history of other specified conditions: Secondary | ICD-10-CM | POA: Diagnosis not present

## 2018-10-29 DIAGNOSIS — L570 Actinic keratosis: Secondary | ICD-10-CM | POA: Diagnosis not present

## 2018-10-29 DIAGNOSIS — D229 Melanocytic nevi, unspecified: Secondary | ICD-10-CM | POA: Diagnosis not present

## 2018-12-29 ENCOUNTER — Encounter: Payer: Self-pay | Admitting: Adult Health

## 2018-12-29 ENCOUNTER — Ambulatory Visit (INDEPENDENT_AMBULATORY_CARE_PROVIDER_SITE_OTHER): Payer: PPO | Admitting: Adult Health

## 2018-12-29 ENCOUNTER — Other Ambulatory Visit: Payer: Self-pay

## 2018-12-29 VITALS — BP 105/74 | HR 64 | Temp 98.2°F | Ht 68.0 in | Wt 151.8 lb

## 2018-12-29 DIAGNOSIS — G40909 Epilepsy, unspecified, not intractable, without status epilepticus: Secondary | ICD-10-CM | POA: Diagnosis not present

## 2018-12-29 DIAGNOSIS — Z5181 Encounter for therapeutic drug level monitoring: Secondary | ICD-10-CM | POA: Diagnosis not present

## 2018-12-29 NOTE — Progress Notes (Signed)
PATIENT: Ethan Santana DOB: Aug 03, 1950  REASON FOR VISIT: follow up HISTORY FROM: patient  HISTORY OF PRESENT ILLNESS: Today 12/29/18: Ethan Santana is a 68 year old male with a history of seizures.  He returns today for follow-up.  He remains on carbamazepine.  He denies any new symptoms.  Denies any seizures.  Reports that he tolerates the medication well.  He does have some diplopia particularly when looking upward.  He feels that this is related to his Tegretol levels.  This is previously been reported in the past.  He denies any changes with his gait or balance.  He is able to complete all ADLs independently.  He operates a Librarian, academic without difficulty.    REVIEW OF SYSTEMS: Out of a complete 14 system review of symptoms, the patient complains only of the following symptoms, and all other reviewed systems are negative.  See HPI  ALLERGIES: No Known Allergies  HOME MEDICATIONS: Outpatient Medications Prior to Visit  Medication Sig Dispense Refill  . aspirin-acetaminophen-caffeine (EXCEDRIN MIGRAINE) 250-250-65 MG tablet Take 1 tablet by mouth daily as needed for headache or migraine.    . calcium carbonate (TUMS EX) 750 MG chewable tablet Chew 1-2 tablets by mouth See admin instructions. 1 tab in the morning, 2 tabs at dinner    . carbamazepine (TEGRETOL) 200 MG tablet TAKE 1/2 TABLET IN THE MORNING AND 1 TABLET IN THE EVENING. 135 tablet 3  . Cholecalciferol (VITAMIN D) 2000 UNITS CAPS Take 2,000 Units by mouth daily.    . famotidine (PEPCID) 20 MG tablet Take 20 mg by mouth daily.    . fluticasone (FLONASE) 50 MCG/ACT nasal spray Place 1 spray into both nostrils daily.     Marland Kitchen ibuprofen (ADVIL,MOTRIN) 200 MG tablet Take 200-600 mg by mouth every 8 (eight) hours as needed (severe pain).     . Multiple Vitamins-Minerals (CENTRUM SILVER PO) Take 1 tablet by mouth daily.    . nadolol (CORGARD) 40 MG tablet Take 40 mg by mouth daily.     Marland Kitchen omeprazole (PRILOSEC) 20 MG capsule  Take 20 mg by mouth daily as needed (indigestion).     . pseudoephedrine (SUDAFED) 30 MG tablet Take 30 mg by mouth daily as needed for congestion.    . psyllium (REGULOID) 0.52 G capsule Take 0.52 g by mouth 4 (four) times daily.    Marland Kitchen SIMETHICONE PO Take 1 tablet by mouth daily with supper. As needed    . sodium chloride (OCEAN) 0.65 % SOLN nasal spray Place 1 spray into both nostrils as needed for congestion.     No facility-administered medications prior to visit.     PAST MEDICAL HISTORY: Past Medical History:  Diagnosis Date  . ADD (attention deficit disorder)   . Arthritis    "little joint pain in my hands" (05/06/2018)  . Chronic urticaria   . Diplopia 01/11/2015  . GERD (gastroesophageal reflux disease)   . HBP (high blood pressure)   . HNP (herniated nucleus pulposus), lumbar   . Hx of seasonal allergies   . Migraine    "maybe monthly since sinus OR" (05/06/2018)  . Pneumonia 1990s   "once" (05/06/2018)  . Refill clinic medication management patient 01/11/2015  . Temporal lobe epilepsy [facility])    "nobody would ever know it but me" (05/06/2018)  . Wears glasses     PAST SURGICAL HISTORY: Past Surgical History:  Procedure Laterality Date  . BACK SURGERY    . COLONOSCOPY    . LUMBAR LAMINECTOMY/DECOMPRESSION MICRODISCECTOMY  N/A 05/06/2018   Procedure: MICRODISCECTOMY LUMBAR ONE- LUMBAR TWO;  Surgeon: Lisbeth Renshaw, MD;  [facility];  Service: Neurosurgery;  Laterality: N/A;  . LUMBAR MICRODISCECTOMY  05/06/2018   L1-L2  . NASAL SINUS SURGERY  2000s  . TONSILLECTOMY    . WISDOM TOOTH EXTRACTION      FAMILY HISTORY: Family History  Problem Relation Age of Onset  . Stroke Mother   . Cancer Mother   . Bone cancer Father     SOCIAL HISTORY: Social History   Socioeconomic History  . Marital status: Married    Spouse name: Ethan Santana   . Number of children: 2  . Years of education: 12+  . Highest education level: Not on file  Occupational History  .  Occupation: Self empolyed   Social Needs  . Financial resource strain: Not on file  . Food insecurity    Worry: Not on file    Inability: Not on file  . Transportation needs    Medical: Not on file    Non-medical: Not on file  Tobacco Use  . Smoking status: Never Smoker  . Smokeless tobacco: Never Used  Substance and Sexual Activity  . Alcohol use: Not Currently  . Drug use: Never  . Sexual activity: Yes  Lifestyle  . Physical activity    Days per week: Not on file    Minutes per session: Not on file  . Stress: Not on file  Relationships  . Social Musician on phone: Not on file    Gets together: Not on file    Attends religious service: Not on file    Active member of club or organization: Not on file    Attends meetings of clubs or organizations: Not on file    Relationship status: Not on file  . Intimate partner violence    Fear of current or ex partner: Not on file    Emotionally abused: Not on file    Physically abused: Not on file    Forced sexual activity: Not on file  Other Topics Concern  . Not on file  Social History Narrative   Patient lives at home wife Ethan Santana    Have have 2 children.    Patient is right handed.    Patient has his masters   Patient works at home.       PHYSICAL EXAM  Vitals:   12/29/18 1311  BP: 105/74  Pulse: 64  Temp: 98.2 F (36.8 C)  TempSrc: Oral  Weight: 151 lb 12.8 oz (68.9 kg)  Height: 5\' 8"  (1.727 m)   Body mass index is 23.08 kg/m.  Generalized: Well developed, in no acute distress   Neurological examination  Mentation: Alert oriented to time, place, history taking. Follows all commands speech and language fluent Cranial nerve II-XII: Pupils were equal round reactive to light. Extraocular movements were full, visual field were full on confrontational test.  Head turning and shoulder shrug  were normal and symmetric. Motor: The motor testing reveals 5 over 5 strength of all 4 extremities. Good  symmetric motor tone is noted throughout.  Sensory: Sensory testing is intact to soft touch on all 4 extremities. No evidence of extinction is noted.  Coordination: Cerebellar testing reveals good finger-nose-finger and heel-to-shin bilaterally.  Gait and station: Gait is normal.  Reflexes: Deep tendon reflexes are symmetric and normal bilaterally.   DIAGNOSTIC DATA (LABS, IMAGING, TESTING) - I reviewed patient records, labs, notes, testing and imaging myself where available.  Lab Results  Component Value Date   WBC 5.1 05/06/2018   HGB 16.1 05/06/2018   HCT 46.3 05/06/2018   MCV 91.9 05/06/2018   PLT 175 05/06/2018      Component Value Date/Time   NA 134 (L) 05/06/2018 0810   NA 131 (L) 11/05/2017 1513   K 4.1 05/06/2018 0810   CL 99 05/06/2018 0810   CO2 22 05/06/2018 0810   GLUCOSE 105 (H) 05/06/2018 0810   BUN 29 (H) 05/06/2018 0810   BUN 20 11/05/2017 1513   CREATININE 0.95 05/06/2018 0810   CALCIUM 9.0 05/06/2018 0810   PROT 6.8 11/05/2017 1513   ALBUMIN 4.3 11/05/2017 1513   AST 16 11/05/2017 1513   ALT 15 11/05/2017 1513   ALKPHOS 89 11/05/2017 1513   BILITOT 0.4 11/05/2017 1513   GFRNONAA >60 05/06/2018 0810   GFRAA >60 05/06/2018 0810      ASSESSMENT AND PLAN 68 y.o. year old male  has a past medical history of ADD (attention deficit disorder), Arthritis, Chronic urticaria, Diplopia (01/11/2015), GERD (gastroesophageal reflux disease), HBP (high blood pressure), HNP (herniated nucleus pulposus), lumbar, seasonal allergies, Migraine, Pneumonia (1990s), Refill clinic medication management patient (01/11/2015), Temporal lobe epilepsy ([facility]), and Wears glasses. here with:  1.  Seizures  Overall the patient is doing well.  He will continue on carbamazepine.  I will check blood work today.  He is advised that if he has any seizure events he should let us know.  He will follow-up in 1 year or sooner if needed.   I spent 15 minutes with the patient. 50% of this time was  spent   Butch Penny, MSN, NP-C 12/29/2018, 1:40 PM [facility] [address], [address] [place], [place] [address] [phone]

## 2018-12-29 NOTE — Patient Instructions (Signed)
Your Plan:  Continue carbamazepine If your symptoms worsen or you develop new symptoms please let us know.   Thank you for coming to see [place] at [facility]. I hope we have been able to provide you high quality care today.  You may receive a patient satisfaction survey over the next few weeks. We would appreciate your feedback and comments so that we may continue to improve ourselves and the health of our patients.

## 2018-12-30 ENCOUNTER — Telehealth: Payer: Self-pay | Admitting: *Deleted

## 2018-12-30 LAB — COMPREHENSIVE METABOLIC PANEL
ALT: 11 IU/L (ref 0–44)
AST: 13 IU/L (ref 0–40)
Albumin/Globulin Ratio: 2.3 — ABNORMAL HIGH (ref 1.2–2.2)
Albumin: 4.8 g/dL (ref 3.8–4.8)
Alkaline Phosphatase: 111 IU/L (ref 39–117)
BUN/Creatinine Ratio: 22 (ref 10–24)
BUN: 23 mg/dL (ref 8–27)
Bilirubin Total: 0.4 mg/dL (ref 0.0–1.2)
CO2: 25 mmol/L (ref 20–29)
Calcium: 9.6 mg/dL (ref 8.6–10.2)
Chloride: 96 mmol/L (ref 96–106)
Creatinine, Ser: 1.06 mg/dL (ref 0.76–1.27)
GFR calc Af Amer: 84 mL/min/{1.73_m2} (ref >59)
GFR calc non Af Amer: 73 mL/min/{1.73_m2} (ref >59)
Globulin, Total: 2.1 g/dL (ref 1.5–4.5)
Glucose: 96 mg/dL (ref 65–99)
Potassium: 5.3 mmol/L — ABNORMAL HIGH (ref 3.5–5.2)
Sodium: 137 mmol/L (ref 134–144)
Total Protein: 6.9 g/dL (ref 6.0–8.5)

## 2018-12-30 LAB — CBC WITH DIFFERENTIAL/PLATELET
Basophils Absolute: 0 10*3/uL (ref 0.0–0.2)
Basos: 1 %
EOS (ABSOLUTE): 0.2 10*3/uL (ref 0.0–0.4)
Eos: 4 %
Hematocrit: 48.4 % (ref 37.5–51.0)
Hemoglobin: 17.2 g/dL (ref 13.0–17.7)
Immature Grans (Abs): 0 10*3/uL (ref 0.0–0.1)
Immature Granulocytes: 0 %
Lymphocytes Absolute: 1.6 10*3/uL (ref 0.7–3.1)
Lymphs: 30 %
MCH: 31 pg (ref 26.6–33.0)
MCHC: 35.5 g/dL (ref 31.5–35.7)
MCV: 87 fL (ref 79–97)
Monocytes Absolute: 0.5 10*3/uL (ref 0.1–0.9)
Monocytes: 10 %
Neutrophils Absolute: 3.1 10*3/uL (ref 1.4–7.0)
Neutrophils: 55 %
Platelets: 155 10*3/uL (ref 150–450)
RBC: 5.55 x10E6/uL (ref 4.14–5.80)
RDW: 13.2 % (ref 11.6–15.4)
WBC: 5.5 10*3/uL (ref 3.4–10.8)

## 2018-12-30 LAB — CARBAMAZEPINE LEVEL, TOTAL: Carbamazepine (Tegretol), S: 6.6 ug/mL (ref 4.0–12.0)

## 2018-12-30 NOTE — Telephone Encounter (Signed)
I called pt and relayed that per MM/NP lab results were unremarkable.  He verbalized understanding. Will forward results to Dr. Hulan Fess.

## 2018-12-30 NOTE — Telephone Encounter (Signed)
-----   Message from Ward Givens, NP sent at 12/30/2018 11:05 AM EDT ----- Labs results are unremarkable. Please call patient with results. Please send to PCP dr. Rex Kras

## 2019-03-26 ENCOUNTER — Other Ambulatory Visit: Payer: Self-pay | Admitting: *Deleted

## 2019-03-26 DIAGNOSIS — H532 Diplopia: Secondary | ICD-10-CM

## 2019-03-26 DIAGNOSIS — G40909 Epilepsy, unspecified, not intractable, without status epilepticus: Secondary | ICD-10-CM

## 2019-03-26 DIAGNOSIS — Z760 Encounter for issue of repeat prescription: Secondary | ICD-10-CM

## 2019-03-26 DIAGNOSIS — R0683 Snoring: Secondary | ICD-10-CM

## 2019-03-26 MED ORDER — CARBAMAZEPINE 200 MG PO TABS: ORAL_TABLET | ORAL | 3 refills | Status: DC

## 2019-06-29 DIAGNOSIS — Z125 Encounter for screening for malignant neoplasm of prostate: Secondary | ICD-10-CM | POA: Diagnosis not present

## 2019-06-29 DIAGNOSIS — Z1159 Encounter for screening for other viral diseases: Secondary | ICD-10-CM | POA: Diagnosis not present

## 2019-06-29 DIAGNOSIS — I10 Essential (primary) hypertension: Secondary | ICD-10-CM | POA: Diagnosis not present

## 2019-06-29 DIAGNOSIS — R7301 Impaired fasting glucose: Secondary | ICD-10-CM | POA: Diagnosis not present

## 2019-07-01 DIAGNOSIS — R002 Palpitations: Secondary | ICD-10-CM | POA: Diagnosis not present

## 2019-07-01 DIAGNOSIS — I10 Essential (primary) hypertension: Secondary | ICD-10-CM | POA: Diagnosis not present

## 2019-07-01 DIAGNOSIS — G40909 Epilepsy, unspecified, not intractable, without status epilepticus: Secondary | ICD-10-CM | POA: Diagnosis not present

## 2019-07-01 DIAGNOSIS — R7301 Impaired fasting glucose: Secondary | ICD-10-CM | POA: Diagnosis not present

## 2019-07-01 DIAGNOSIS — J309 Allergic rhinitis, unspecified: Secondary | ICD-10-CM | POA: Diagnosis not present

## 2019-07-01 DIAGNOSIS — Z0000 Encounter for general adult medical examination without abnormal findings: Secondary | ICD-10-CM | POA: Diagnosis not present

## 2019-07-01 DIAGNOSIS — E039 Hypothyroidism, unspecified: Secondary | ICD-10-CM | POA: Diagnosis not present

## 2019-07-01 DIAGNOSIS — D696 Thrombocytopenia, unspecified: Secondary | ICD-10-CM | POA: Diagnosis not present

## 2019-07-01 DIAGNOSIS — D582 Other hemoglobinopathies: Secondary | ICD-10-CM | POA: Diagnosis not present

## 2019-07-01 DIAGNOSIS — F900 Attention-deficit hyperactivity disorder, predominantly inattentive type: Secondary | ICD-10-CM | POA: Diagnosis not present

## 2019-07-09 ENCOUNTER — Other Ambulatory Visit: Payer: Self-pay

## 2019-07-09 ENCOUNTER — Ambulatory Visit: Payer: PPO | Admitting: Cardiology

## 2019-07-09 ENCOUNTER — Encounter: Payer: Self-pay | Admitting: Cardiology

## 2019-07-09 VITALS — BP 110/70 | HR 68 | Temp 97.5°F | Ht 67.0 in | Wt 157.0 lb

## 2019-07-09 DIAGNOSIS — Z7189 Other specified counseling: Secondary | ICD-10-CM | POA: Diagnosis not present

## 2019-07-09 DIAGNOSIS — R002 Palpitations: Secondary | ICD-10-CM

## 2019-07-09 DIAGNOSIS — I10 Essential (primary) hypertension: Secondary | ICD-10-CM | POA: Diagnosis not present

## 2019-07-09 NOTE — Patient Instructions (Signed)
Medication Instructions:  Your Physician recommend you continue on your current medication as directed.    *If you need a refill on your cardiac medications before your next appointment, please call your pharmacy*   Lab Work: None   Testing/Procedures: None   Follow-Up: At [facility], you and your health needs are our priority.  As part of our continuing mission to provide you with exceptional heart care, we have created designated Provider Care Teams.  These Care Teams include your primary Cardiologist (physician) and Advanced Practice Providers (APPs -  Physician Assistants and Nurse Practitioners) who all work together to provide you with the care you need, when you need it.  We recommend signing up for the patient portal called "MyChart".  Sign up information is provided on this After Visit Summary.  MyChart is used to connect with patients for Virtual Visits (Telemedicine).  Patients are able to view lab/test results, encounter notes, upcoming appointments, etc.  Non-urgent messages can be sent to your provider as well.   To learn more about what you can do with MyChart, go to [email or web address].    Your next appointment:   As needed  The format for your next appointment:   Either In Person or Virtual  Provider:   Bridgette Christopher, MD   

## 2019-07-09 NOTE — Progress Notes (Signed)
Cardiology Office Note:    Date:  07/09/2019   ID:  Ethan Santana, DOB 04/23/1951, MRN [id number]  PCP:  Catha Gosselin, MD  Cardiologist:  Jodelle Red, MD  Referring MD: Catha Gosselin, MD   CC: new patient consultation for palpitations  History of Present Illness:    Ethan Santana is a 69 y.o. male with a hx of seizure disorder, hypertension, ADD, lumbar spinal stenosis who is seen as a new consult at the request of Catha Gosselin, MD for the evaluation and management of palpitations.  Notes reviewed from recent visit 07/01/19 by Dr. Clarene Duke. Noted to have history of intermittent palpitations.  Today: He feels that he has periods of "afib". He uses this to define palpitations that are irregular. Longest episode was about a year ago, last about 20 minutes. Stopped taking Adderall, went away the next day. His palpitations tends to last intermittently for 2-6 weeks when it occurs. Has never been formally diagnosed as afib.  First noted skipped beats when he was 10-22 years old. In childhood, these were single beats that he felt only rarely. Did not notice that it became more frequent until ~15 years ago. Wore a 24 hour monitor, which he reports did not show anything. Since last February, has had one episode that lasted about a week, another that lasted about a month. Not continuous during that time, just more frequent. Has felt only once in the last week.   Denies chest pain, shortness of breath at rest or with normal exertion. No PND, orthopnea, LE edema or unexpected weight gain. No syncope.  FH: mother had afib. Father had 5V CABG in his 36s-80s.   Labs reviewed by [facility]. TSH 8.1, Hgb 17.9, A1c 5.2, ALT 14.  Past Medical History:  Diagnosis Date  . ADD (attention deficit disorder)   . Arthritis    "little joint pain in my hands" (05/06/2018)  . Chronic urticaria   . Diplopia 01/11/2015  . GERD (gastroesophageal reflux disease)   . HBP (high blood pressure)   . HNP (herniated  nucleus pulposus), lumbar   . Hx of seasonal allergies   . Migraine    "maybe monthly since sinus OR" (05/06/2018)  . Pneumonia 1990s   "once" (05/06/2018)  . Refill clinic medication management patient 01/11/2015  . Temporal lobe epilepsy [facility])    "nobody would ever know it but me" (05/06/2018)  . Wears glasses     Past Surgical History:  Procedure Laterality Date  . BACK SURGERY    . COLONOSCOPY    . LUMBAR LAMINECTOMY/DECOMPRESSION MICRODISCECTOMY N/A 05/06/2018   Procedure: MICRODISCECTOMY LUMBAR ONE- LUMBAR TWO;  Surgeon: Lisbeth Renshaw, MD;  Location: [facility] OR;  Service: Neurosurgery;  Laterality: N/A;  . LUMBAR MICRODISCECTOMY  05/06/2018   L1-L2  . NASAL SINUS SURGERY  2000s  . TONSILLECTOMY    . WISDOM TOOTH EXTRACTION      Current Medications: Current Outpatient Medications on File Prior to Visit  Medication Sig  . aspirin-acetaminophen-caffeine (EXCEDRIN MIGRAINE) 250-250-65 MG tablet Take 1 tablet by mouth daily as needed for headache or migraine.  . calcium carbonate (TUMS EX) 750 MG chewable tablet Chew 1-2 tablets by mouth See admin instructions. 1 tab in the morning, 2 tabs at dinner  . carbamazepine (TEGRETOL) 200 MG tablet TAKE 1/2 TABLET IN THE MORNING AND 1 TABLET IN THE EVENING.  Marland Kitchen Cholecalciferol (VITAMIN D) 2000 UNITS CAPS Take 2,000 Units by mouth daily.  Marland Kitchen dextromethorphan-guaiFENesin (MUCINEX DM) 30-600 MG 12hr tablet Take 1 tablet  by mouth 2 (two) times daily. 400MG  TWICE A DAY. NOT TIME RELEASE  . famotidine (PEPCID) 20 MG tablet Take 20 mg by mouth daily.  . fluticasone (FLONASE) 50 MCG/ACT nasal spray Place 1 spray into both nostrils daily.   . Multiple Vitamins-Minerals (CENTRUM SILVER PO) Take 1 tablet by mouth daily.  . nadolol (CORGARD) 40 MG tablet Take 40 mg by mouth daily.   Marland Kitchen omeprazole (PRILOSEC) 20 MG capsule Take 20 mg by mouth daily as needed (indigestion).   . pseudoephedrine (SUDAFED) 30 MG tablet Take 30 mg by mouth daily as needed for  congestion.  . psyllium (REGULOID) 0.52 G capsule Take 0.52 g by mouth 4 (four) times daily.  Marland Kitchen SIMETHICONE PO Take 1 tablet by mouth daily with supper. As needed  . sodium chloride (OCEAN) 0.65 % SOLN nasal spray Place 1 spray into both nostrils as needed for congestion.   No current facility-administered medications on file prior to visit.     Allergies:   Patient has no known allergies.   Social History   Tobacco Use  . Smoking status: Never Smoker  . Smokeless tobacco: Never Used  Substance Use Topics  . Alcohol use: Not Currently  . Drug use: Never    Family History: family history includes Bone cancer in his father; Cancer in his mother; Stroke in his mother.  ROS:   Please see the history of present illness.  Additional pertinent ROS: Constitutional: Negative for chills, fever, night sweats, unintentional weight loss  HENT: Negative for ear pain and hearing loss.   Eyes: Negative for loss of vision and eye pain.  Respiratory: Negative for cough, sputum, wheezing.   Cardiovascular: See HPI. Gastrointestinal: Negative for abdominal pain, melena, and hematochezia.  Genitourinary: Negative for dysuria and hematuria.  Musculoskeletal: Negative for falls and myalgias.  Skin: Negative for itching and rash. Remotely had urticaria, then had purpura with topical steroids. None recently. Neurological: Negative for focal weakness, focal sensory changes and loss of consciousness.  Endo/Heme/Allergies: Does not bruise/bleed easily now.     EKGs/Labs/Other Studies Reviewed:    The following studies were reviewed today: No prior cardiac studies  EKG:  EKG is personally reviewed.  The ekg ordered today demonstrates NSR  Recent Labs: 12/29/2018: ALT 11; BUN 23; Creatinine, Ser 1.06; Hemoglobin 17.2; Platelets 155; Potassium 5.3; Sodium 137  Recent Lipid Panel No results found for: CHOL, TRIG, HDL, CHOLHDL, VLDL, LDLCALC, LDLDIRECT  Physical Exam:    VS:  BP 110/70   Pulse 68    Temp (!) 97.5 F (36.4 C)   Ht 5\' 7"  (1.702 m)   Wt 157 lb (71.2 kg)   SpO2 97%   BMI 24.59 kg/m     Wt Readings from Last 3 Encounters:  07/09/19 157 lb (71.2 kg)  12/29/18 151 lb 12.8 oz (68.9 kg)  05/06/18 150 lb (68 kg)    GEN: Well nourished, well developed in no acute distress HEENT: Normal, moist mucous membranes NECK: No JVD CARDIAC: regular rhythm, normal S1 and S2, no rubs or gallops. No murmurs. VASCULAR: Radial and DP pulses 2+ bilaterally. No carotid bruits RESPIRATORY:  Clear to auscultation without rales, wheezing or rhonchi  ABDOMEN: Soft, non-tender, non-distended MUSCULOSKELETAL:  Ambulates independently SKIN: Warm and dry, no edema NEUROLOGIC:  Alert and oriented x 3. No focal neuro deficits noted. PSYCHIATRIC:  Normal affect    ASSESSMENT:    1. Palpitation   2. Essential hypertension   3. Cardiac risk counseling   4.  Counseling on health promotion and disease prevention    PLAN:    Palpitations: sporadic, infrequent. No high risk features. He has self-labeled as afib but has never had a formal diagnosis.  -we discussed options for evaluation. Discussed the pros and cons for event monitor such as 14 day Zio vs symptom based device such as [employer] -after discussion, he would like to hold on a device for now. However, he will call when he starts to have symptoms again. At that time, we will send a 14 day Zio monitor and try to capture events. -discussed red flag warning signs that need immediate medical attention -discussed potential triggers. He will watch for patterns -ECG normal sinus rhythm today  Hypertension: well controlled today -continue nadolol  Cardiac risk counseling and prevention recommendations: for primary prevention -recommend heart healthy/Mediterranean diet, with whole grains, fruits, vegetable, fish, lean meats, nuts, and olive oil. Limit salt. -recommend moderate walking, 3-5 times/week for 30-50 minutes each session. Aim  for at least 150 minutes.week. Goal should be pace of 3 miles/hours, or walking 1.5 miles in 30 minutes  Plan for follow up: TBD based on results of monitor  Jodelle Red, MD, PhD [place]  [facility]    Medication Adjustments/Labs and Tests Ordered: Current medicines are reviewed at length with the patient today.  Concerns regarding medicines are outlined above.  Orders Placed This Encounter  Procedures  . EKG 12-Lead   No orders of the defined types were placed in this encounter.   Patient Instructions  Medication Instructions:  Your Physician recommend you continue on your current medication as directed.    *If you need a refill on your cardiac medications before your next appointment, please call your pharmacy*   Lab Work: None   Testing/Procedures: None   Follow-Up: At [facility], you and your health needs are our priority.  As part of our continuing mission to provide you with exceptional heart care, we have created designated Provider Care Teams.  These Care Teams include your primary Cardiologist (physician) and Advanced Practice Providers (APPs -  Physician Assistants and Nurse Practitioners) who all work together to provide you with the care you need, when you need it.  We recommend signing up for the patient portal called "MyChart".  Sign up information is provided on this After Visit Summary.  MyChart is used to connect with patients for Virtual Visits (Telemedicine).  Patients are able to view lab/test results, encounter notes, upcoming appointments, etc.  Non-urgent messages can be sent to your provider as well.   To learn more about what you can do with MyChart, go to [email or web address].    Your next appointment:   As needed  The format for your next appointment:   Either In Person or Virtual  Provider:   Jodelle Red, MD     Signed, Jodelle Red, MD PhD 07/09/2019 5:41 PM    [facility]

## 2019-07-25 IMAGING — CR DG LUMBAR SPINE 2-3V
2 series · 2 of 2 positions shown · non-contrast
Comparison: Lumbar spine series dated March 22, 2018

CLINICAL DATA: Two lateral localization radiographs from the
operating room.

EXAM:
LUMBAR SPINE - 2-3 VIEW

[lateral (1 of 2)]
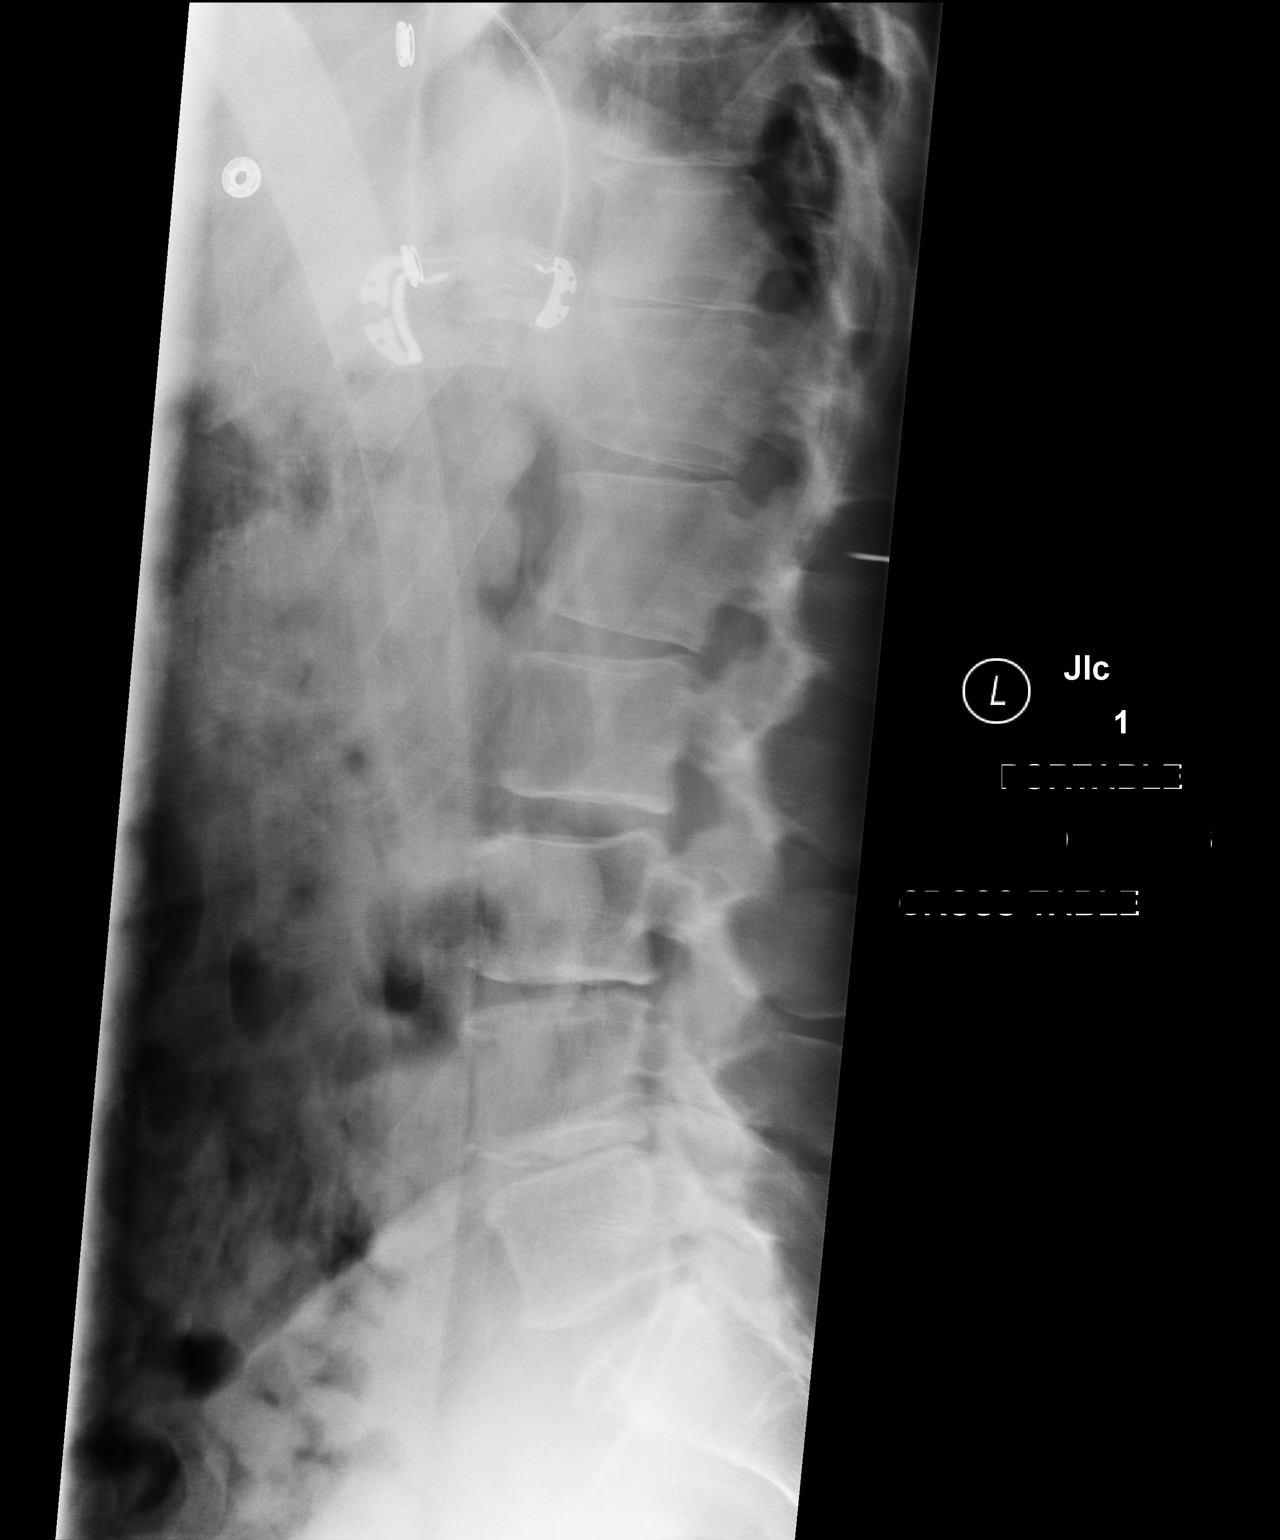

[lateral (2 of 2)]
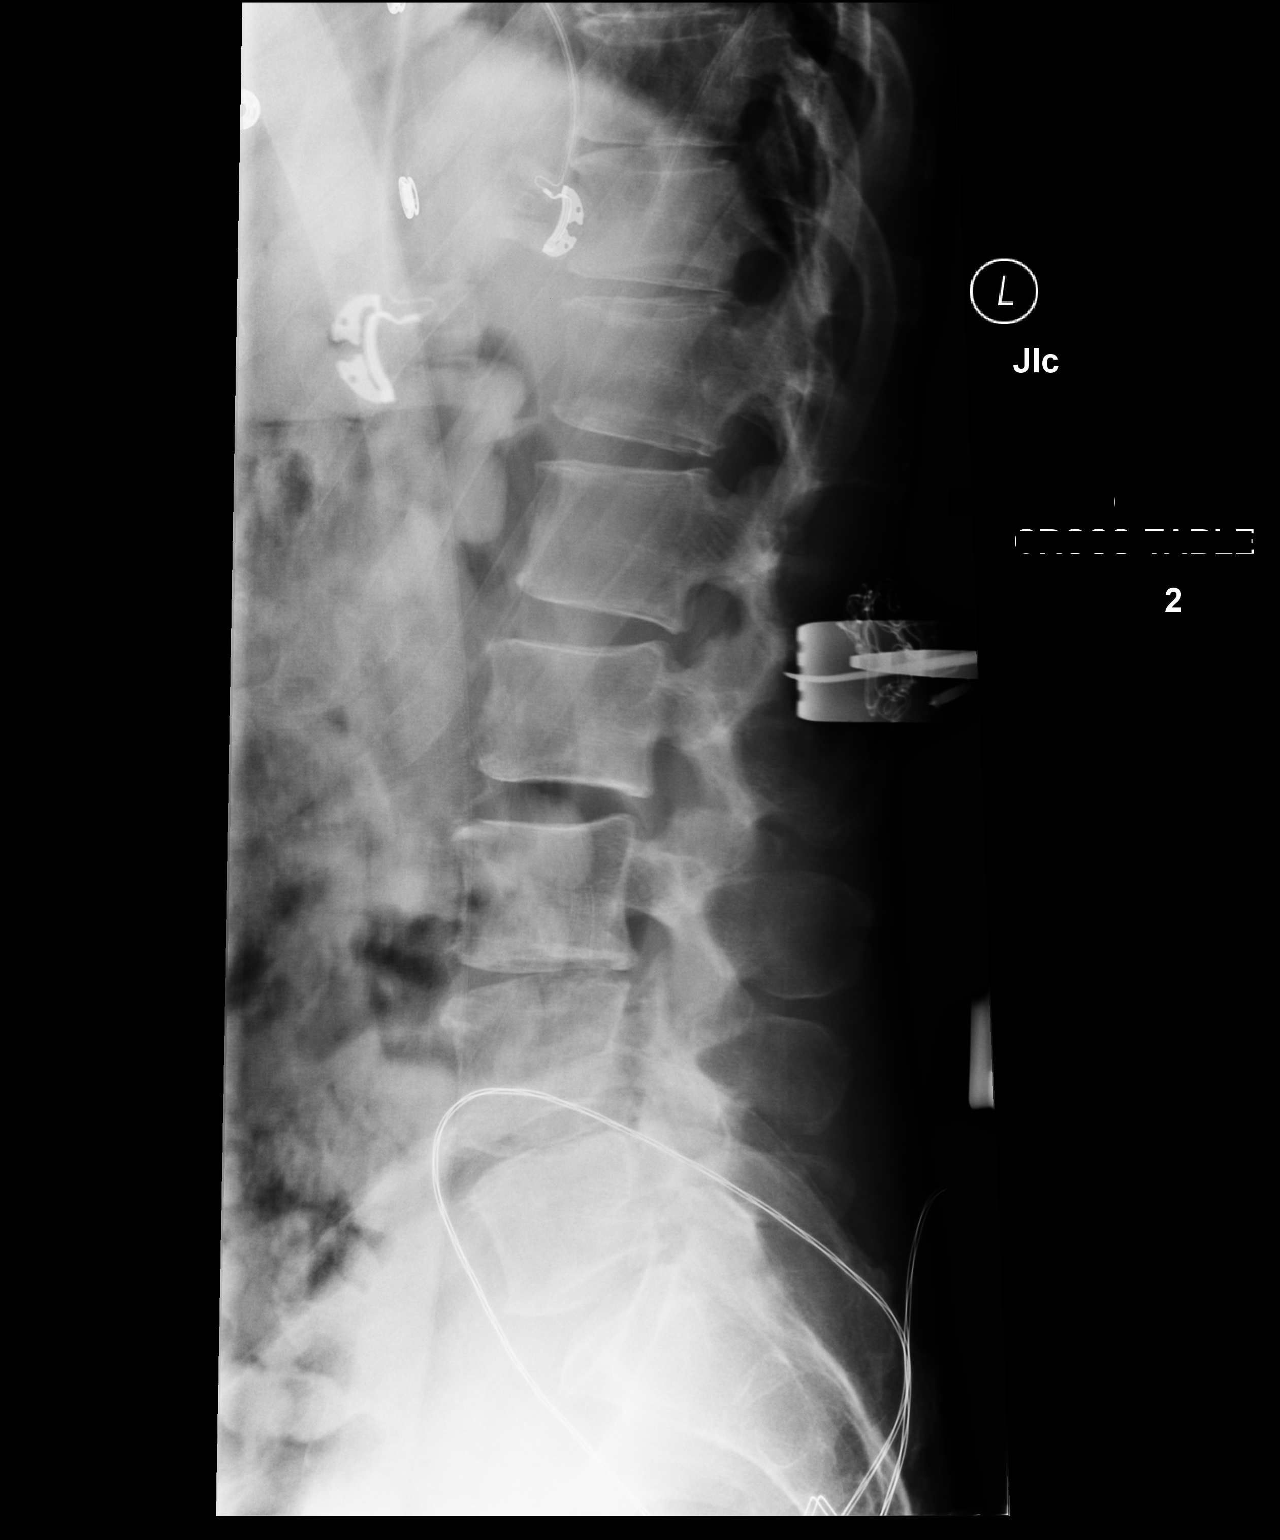

[2 of 2 positions shown; findings below may reference images not displayed]

FINDINGS: The initial image timed at [DATE] a.m. on May 06, 2018 reveals a
metallic needle overlying the spinous process of L1. The second
image timed at [DATE] a.m. on May 06, 2018 reveals a tissue
spreader device with metallic probes present at the L1-2 level
approximately 3.4 cm posterior to the 1-2 disc space disc space.
IMPRESSION: Two lateral localization radiographs with findings as described.

## 2019-08-04 ENCOUNTER — Ambulatory Visit: Payer: PPO | Admitting: Adult Health

## 2019-08-04 ENCOUNTER — Other Ambulatory Visit: Payer: Self-pay

## 2019-08-04 ENCOUNTER — Encounter: Payer: Self-pay | Admitting: Adult Health

## 2019-08-04 VITALS — BP 152/88 | HR 60 | Temp 97.5°F | Ht 67.5 in | Wt 157.0 lb

## 2019-08-04 DIAGNOSIS — H532 Diplopia: Secondary | ICD-10-CM | POA: Diagnosis not present

## 2019-08-04 DIAGNOSIS — G40909 Epilepsy, unspecified, not intractable, without status epilepticus: Secondary | ICD-10-CM

## 2019-08-04 MED ORDER — CARBAMAZEPINE 200 MG PO TABS: ORAL_TABLET | ORAL | 3 refills | Status: DC

## 2019-08-04 MED ORDER — LEVETIRACETAM 250 MG PO TABS: 250.0000 mg | ORAL_TABLET | Freq: Two times a day (BID) | ORAL | 5 refills | Status: DC

## 2019-08-04 NOTE — Patient Instructions (Signed)
Your Plan:  Start Keppra 250 mg twice a day for two weeks. If tolerating well then start weaning off Carbamazepine. Decrease to 1/2 tablet at bedtime for 4 days then discontinue the medication.   Thank you for coming to see [place] at [facility]. I hope we have been able to provide you high quality care today.  You may receive a patient satisfaction survey over the next few weeks. We would appreciate your feedback and comments so that we may continue to improve ourselves and the health of our patients.  Levetiracetam tablets What is this medicine? LEVETIRACETAM (lee ve tye RA se tam) is an antiepileptic drug. It is used with other medicines to treat certain types of seizures. This medicine may be used for other purposes; ask your health care provider or pharmacist if you have questions. COMMON BRAND NAME(S): Keppra, Roweepra What should I tell my health care provider before I take this medicine? They need to know if you have any of these conditions:  kidney disease  suicidal thoughts, plans, or attempt; a previous suicide attempt by you or a family member  an unusual or allergic reaction to levetiracetam, other medicines, foods, dyes, or preservatives  pregnant or trying to get pregnant  breast-feeding How should I use this medicine? Take this medicine by mouth with a glass of water. Follow the directions on the prescription label. Swallow the tablets whole. Do not crush or chew this medicine. You may take this medicine with or without food. Take your doses at regular intervals. Do not take your medicine more often than directed. Do not stop taking this medicine or any of your seizure medicines unless instructed by your doctor or health care professional. Stopping your medicine suddenly can increase your seizures or their severity. A special MedGuide will be given to you by the pharmacist with each prescription and refill. Be sure to read this information carefully each  time. Contact your pediatrician or health care professional regarding the use of this medication in children. While this drug may be prescribed for children as young as 24 years of age for selected conditions, precautions do apply. Overdosage: If you think you have taken too much of this medicine contact a poison control center or emergency room at once. NOTE: This medicine is only for you. Do not share this medicine with others. What if I miss a dose? If you miss a dose, take it as soon as you can. If it is almost time for your next dose, take only that dose. Do not take double or extra doses. What may interact with this medicine? This medicine may interact with the following medications:  carbamazepine  colesevelam  probenecid  sevelamer This list may not describe all possible interactions. Give your health care provider a list of all the medicines, herbs, non-prescription drugs, or dietary supplements you use. Also tell them if you smoke, drink alcohol, or use illegal drugs. Some items may interact with your medicine. What should I watch for while using this medicine? Visit your doctor or health care provider for a regular check on your progress. Wear a medical identification bracelet or chain to say you have epilepsy, and carry a card that lists all your medications. This medicine may cause serious skin reactions. They can happen weeks to months after starting the medicine. Contact your health care provider right away if you notice fevers or flu-like symptoms with a rash. The rash may be red or purple and then turn into blisters or peeling  of the skin. Or, you might notice a red rash with swelling of the face, lips or lymph nodes in your neck or under your arms. It is important to take this medicine exactly as instructed by your health care provider. When first starting treatment, your dose may need to be adjusted. It may take weeks or months before your dose is stable. You should contact your  doctor or health care provider if your seizures get worse or if you have any new types of seizures. You may get drowsy or dizzy. Do not drive, use machinery, or do anything that needs mental alertness until you know how this medicine affects you. Do not stand or sit up quickly, especially if you are an older patient. This reduces the risk of dizzy or fainting spells. Alcohol may interfere with the effect of this medicine. Avoid alcoholic drinks. The use of this medicine may increase the chance of suicidal thoughts or actions. Pay special attention to how you are responding while on this medicine. Any worsening of mood, or thoughts of suicide or dying should be reported to your health care provider right away. Women who become pregnant while using this medicine may enroll in the [place] Pregnancy Registry by calling [phone]. This registry collects information about the safety of antiepileptic drug use during pregnancy. What side effects may I notice from receiving this medicine? Side effects that you should report to your doctor or health care professional as soon as possible:  allergic reactions like skin rash, itching or hives, swelling of the face, lips, or tongue  breathing problems  dark urine  general ill feeling or flu-like symptoms  problems with balance, talking, walking  rash, fever, and swollen lymph nodes  redness, blistering, peeling or loosening of the skin, including inside the mouth  unusually weak or tired  worsening of mood, thoughts or actions of suicide or dying  yellowing of the eyes or skin Side effects that usually do not require medical attention (report to your doctor or health care professional if they continue or are bothersome):  diarrhea  dizzy, drowsy  headache  loss of appetite This list may not describe all possible side effects. Call your doctor for medical advice about side effects. You may report side effects to FDA  at 1-800-FDA-1088. Where should I keep my medicine? Keep out of reach of children. Store at room temperature between 15 and 30 degrees C (59 and 86 degrees F). Throw away any unused medicine after the expiration date. NOTE: This sheet is a summary. It may not cover all possible information. If you have questions about this medicine, talk to your doctor, pharmacist, or health care provider.  2020 Elsevier/Gold Standard (2018-07-25 15:23:36)

## 2019-08-04 NOTE — Progress Notes (Signed)
I have read the note, and I agree with the clinical assessment and plan.  Tee Richeson K Remmie Bembenek   

## 2019-08-04 NOTE — Progress Notes (Signed)
PATIENT: Ethan Santana DOB: 1951/01/26  REASON FOR VISIT: follow up HISTORY FROM: patient  HISTORY OF PRESENT ILLNESS: Today 08/04/19:  Ethan Santana is a 69 year old male with a history of seizures.  He returns today for follow-up.  He is currently on carbamazepine.  Denies any seizure events.  Reports that 2 weeks ago he decrease his dose of carbamazepine to half a tablet twice a day.  This once due to ongoing double vision.  He reports that his double vision did improve with a decrease in dose but has not resolved.  He did see an ophthalmologist in the distant past that felt that it was due to carbamazepine.  He is interested in trying another medication.  And he is also been on Dilantin in the past.  He states in the past his seizures consisted of him "feeling odd."  He states that he never had a loss of consciousness or convulsing or staring off episodes.  HISTORY 12/29/18: Ethan Santana is a 69 year old male with a history of seizures.  He returns today for follow-up.  He remains on carbamazepine.  He denies any new symptoms.  Denies any seizures.  Reports that he tolerates the medication well.  He does have some diplopia particularly when looking upward.  He feels that this is related to his Tegretol levels.  This is previously been reported in the past.  He denies any changes with his gait or balance.  He is able to complete all ADLs independently.  He operates a Librarian, academic without difficulty.  REVIEW OF SYSTEMS: Out of a complete 14 system review of symptoms, the patient complains only of the following symptoms, and all other reviewed systems are negative.  See HPI  ALLERGIES: No Known Allergies  HOME MEDICATIONS: Outpatient Medications Prior to Visit  Medication Sig Dispense Refill  . aspirin-acetaminophen-caffeine (EXCEDRIN MIGRAINE) 250-250-65 MG tablet Take 1 tablet by mouth daily as needed for headache or migraine.    . calcium carbonate (TUMS EX) 750 MG chewable tablet  Chew 1-2 tablets by mouth See admin instructions. 1 tab in the morning, 2 tabs at dinner    . carbamazepine (TEGRETOL) 200 MG tablet TAKE 1/2 TABLET IN THE MORNING AND 1 TABLET IN THE EVENING. 135 tablet 3  . Cholecalciferol (VITAMIN D) 2000 UNITS CAPS Take 2,000 Units by mouth daily.    Marland Kitchen dextromethorphan-guaiFENesin (MUCINEX DM) 30-600 MG 12hr tablet Take 1 tablet by mouth 2 (two) times daily. 400MG  TWICE A DAY. NOT TIME RELEASE    . famotidine (PEPCID) 20 MG tablet Take 20 mg by mouth daily.    . fluticasone (FLONASE) 50 MCG/ACT nasal spray Place 1 spray into both nostrils daily.     . Multiple Vitamins-Minerals (CENTRUM SILVER PO) Take 1 tablet by mouth daily.    . nadolol (CORGARD) 40 MG tablet Take 40 mg by mouth daily.     Marland Kitchen omeprazole (PRILOSEC) 20 MG capsule Take 20 mg by mouth daily as needed (indigestion).     . pseudoephedrine (SUDAFED) 30 MG tablet Take 30 mg by mouth daily as needed for congestion.    . psyllium (REGULOID) 0.52 G capsule Take 0.52 g by mouth 4 (four) times daily.    Marland Kitchen SIMETHICONE PO Take 1 tablet by mouth daily with supper. As needed    . sodium chloride (OCEAN) 0.65 % SOLN nasal spray Place 1 spray into both nostrils as needed for congestion.     No facility-administered medications prior to visit.  PAST MEDICAL HISTORY: Past Medical History:  Diagnosis Date  . ADD (attention deficit disorder)   . Arthritis    "little joint pain in my hands" (05/06/2018)  . Chronic urticaria   . Diplopia 01/11/2015  . GERD (gastroesophageal reflux disease)   . HBP (high blood pressure)   . HNP (herniated nucleus pulposus), lumbar   . Hx of seasonal allergies   . Migraine    "maybe monthly since sinus OR" (05/06/2018)  . Pneumonia 1990s   "once" (05/06/2018)  . Refill clinic medication management patient 01/11/2015  . Temporal lobe epilepsy [facility])    "nobody would ever know it but me" (05/06/2018)  . Wears glasses     PAST SURGICAL HISTORY: Past Surgical History:    Procedure Laterality Date  . BACK SURGERY    . COLONOSCOPY    . LUMBAR LAMINECTOMY/DECOMPRESSION MICRODISCECTOMY N/A 05/06/2018   Procedure: MICRODISCECTOMY LUMBAR ONE- LUMBAR TWO;  Surgeon: Lisbeth Renshaw, MD;  Location: [facility] OR;  Service: Neurosurgery;  Laterality: N/A;  . LUMBAR MICRODISCECTOMY  05/06/2018   L1-L2  . NASAL SINUS SURGERY  2000s  . TONSILLECTOMY    . WISDOM TOOTH EXTRACTION      FAMILY HISTORY: Family History  Problem Relation Age of Onset  . Stroke Mother   . Cancer Mother   . Bone cancer Father     SOCIAL HISTORY: Social History   Socioeconomic History  . Marital status: Married    Spouse name: Jenel Lucks   . Number of children: 2  . Years of education: 12+  . Highest education level: Not on file  Occupational History  . Occupation: Self empolyed   Tobacco Use  . Smoking status: Never Smoker  . Smokeless tobacco: Never Used  Substance and Sexual Activity  . Alcohol use: Not Currently  . Drug use: Never  . Sexual activity: Yes  Other Topics Concern  . Not on file  Social History Narrative   Patient lives at home wife Jenel Lucks    Have have 2 children.    Patient is right handed.    Patient has his masters   Patient works at home.    Social Determinants of Health   Financial Resource Strain:   . Difficulty of Paying Living Expenses:   Food Insecurity:   . Worried About Programme researcher, broadcasting/film/video in the Last Year:   . Barista in the Last Year:   Transportation Needs:   . Freight forwarder (Medical):   Marland Kitchen Lack of Transportation (Non-Medical):   Physical Activity:   . Days of Exercise per Week:   . Minutes of Exercise per Session:   Stress:   . Feeling of Stress :   Social Connections:   . Frequency of Communication with Friends and Family:   . Frequency of Social Gatherings with Friends and Family:   . Attends Religious Services:   . Active Member of Clubs or Organizations:   . Attends Banker Meetings:   Marland Kitchen Marital  Status:   Intimate Partner Violence:   . Fear of Current or Ex-Partner:   . Emotionally Abused:   Marland Kitchen Physically Abused:   . Sexually Abused:       PHYSICAL EXAM  Vitals:   08/04/19 1421  BP: (!) 152/88  Pulse: 60  Temp: (!) 97.5 F (36.4 C)  Weight: 157 lb (71.2 kg)  Height: 5' 7.5" (1.715 m)   Body mass index is 24.23 kg/m.  Generalized: Well developed, in no acute distress  Neurological examination  Mentation: Alert oriented to time, place, history taking. Follows all commands speech and language fluent Cranial nerve II-XII: Pupils were equal round reactive to light. Extraocular movements were full, visual field were full on confrontational test.  Head turning and shoulder shrug  were normal and symmetric. Motor: The motor testing reveals 5 over 5 strength of all 4 extremities. Good symmetric motor tone is noted throughout.  Sensory: Sensory testing is intact to soft touch on all 4 extremities. No evidence of extinction is noted.  Coordination: Cerebellar testing reveals good finger-nose-finger and heel-to-shin bilaterally.  Gait and station: Gait is normal. Tandem gait is normal. Romberg is negative. No drift is seen.  Reflexes: Deep tendon reflexes are symmetric and normal bilaterally.   DIAGNOSTIC DATA (LABS, IMAGING, TESTING) - I reviewed patient records, labs, notes, testing and imaging myself where available.  Lab Results  Component Value Date   WBC 5.5 12/29/2018   HGB 17.2 12/29/2018   HCT 48.4 12/29/2018   MCV 87 12/29/2018   PLT 155 12/29/2018      Component Value Date/Time   NA 137 12/29/2018 1418   K 5.3 (H) 12/29/2018 1418   CL 96 12/29/2018 1418   CO2 25 12/29/2018 1418   GLUCOSE 96 12/29/2018 1418   GLUCOSE 105 (H) 05/06/2018 0810   BUN 23 12/29/2018 1418   CREATININE 1.06 12/29/2018 1418   CALCIUM 9.6 12/29/2018 1418   PROT 6.9 12/29/2018 1418   ALBUMIN 4.8 12/29/2018 1418   AST 13 12/29/2018 1418   ALT 11 12/29/2018 1418   ALKPHOS 111  12/29/2018 1418   BILITOT 0.4 12/29/2018 1418   GFRNONAA 73 12/29/2018 1418   GFRAA 84 12/29/2018 1418      ASSESSMENT AND PLAN 69 y.o. year old male  has a past medical history of ADD (attention deficit disorder), Arthritis, Chronic urticaria, Diplopia (01/11/2015), GERD (gastroesophageal reflux disease), HBP (high blood pressure), HNP (herniated nucleus pulposus), lumbar, seasonal allergies, Migraine, Pneumonia (1990s), Refill clinic medication management patient (01/11/2015), Temporal lobe epilepsy (HCC), and Wears glasses. here with:  1.  Seizures  -Continue carbamazepine 200 mg half a tablet in the morning and 1/2 tablet in the evening -Start Keppra 250 mg twice a day- side effects reviewed -Advised that while changing medication he is at risk for breakthrough seizure. -Advised if symptoms worsen or he develops new symptoms he should let us know -Follow-up in 6 months or sooner if needed   I spent  30 minutes of face-to-face and non-face-to-face time with patient.  This included previsit chart review, lab review, study review, order entry, electronic health record documentation, patient education.  Butch Penny, MSN, NP-C 08/04/2019, 2:15 PM [facility] [address] [place], [place] [address] [phone]

## 2019-08-05 ENCOUNTER — Ambulatory Visit: Payer: Self-pay | Admitting: Adult Health

## 2019-08-19 ENCOUNTER — Encounter: Payer: Self-pay | Admitting: Adult Health

## 2019-09-29 DIAGNOSIS — G40909 Epilepsy, unspecified, not intractable, without status epilepticus: Secondary | ICD-10-CM | POA: Diagnosis not present

## 2019-09-29 DIAGNOSIS — R7301 Impaired fasting glucose: Secondary | ICD-10-CM | POA: Diagnosis not present

## 2019-09-29 DIAGNOSIS — D696 Thrombocytopenia, unspecified: Secondary | ICD-10-CM | POA: Diagnosis not present

## 2019-09-29 DIAGNOSIS — D582 Other hemoglobinopathies: Secondary | ICD-10-CM | POA: Diagnosis not present

## 2019-09-29 DIAGNOSIS — J309 Allergic rhinitis, unspecified: Secondary | ICD-10-CM | POA: Diagnosis not present

## 2019-09-29 DIAGNOSIS — R002 Palpitations: Secondary | ICD-10-CM | POA: Diagnosis not present

## 2019-09-29 DIAGNOSIS — I10 Essential (primary) hypertension: Secondary | ICD-10-CM | POA: Diagnosis not present

## 2019-09-29 DIAGNOSIS — Z0000 Encounter for general adult medical examination without abnormal findings: Secondary | ICD-10-CM | POA: Diagnosis not present

## 2019-09-29 DIAGNOSIS — E039 Hypothyroidism, unspecified: Secondary | ICD-10-CM | POA: Diagnosis not present

## 2019-09-29 DIAGNOSIS — F900 Attention-deficit hyperactivity disorder, predominantly inattentive type: Secondary | ICD-10-CM | POA: Diagnosis not present

## 2019-10-19 DIAGNOSIS — R002 Palpitations: Secondary | ICD-10-CM

## 2019-10-27 ENCOUNTER — Telehealth: Payer: Self-pay

## 2019-10-27 NOTE — Telephone Encounter (Signed)
14 day Zio registered to be mailed to pt's home address.

## 2019-10-29 ENCOUNTER — Ambulatory Visit: Payer: PPO | Admitting: Physician Assistant

## 2019-10-30 ENCOUNTER — Other Ambulatory Visit (INDEPENDENT_AMBULATORY_CARE_PROVIDER_SITE_OTHER): Payer: PPO

## 2019-10-30 DIAGNOSIS — R002 Palpitations: Secondary | ICD-10-CM

## 2019-12-04 DIAGNOSIS — R002 Palpitations: Secondary | ICD-10-CM | POA: Diagnosis not present

## 2019-12-23 ENCOUNTER — Ambulatory Visit: Payer: PPO | Admitting: Adult Health

## 2019-12-25 ENCOUNTER — Encounter: Payer: Self-pay | Admitting: Physician Assistant

## 2019-12-25 ENCOUNTER — Ambulatory Visit: Payer: PPO | Admitting: Physician Assistant

## 2019-12-25 ENCOUNTER — Other Ambulatory Visit: Payer: Self-pay

## 2019-12-25 DIAGNOSIS — L570 Actinic keratosis: Secondary | ICD-10-CM

## 2019-12-25 DIAGNOSIS — Z872 Personal history of diseases of the skin and subcutaneous tissue: Secondary | ICD-10-CM

## 2019-12-25 DIAGNOSIS — Z1283 Encounter for screening for malignant neoplasm of skin: Secondary | ICD-10-CM

## 2019-12-25 MED ORDER — CLOBETASOL PROPIONATE 0.05 % EX SOLN: 1.0000 "application " | Freq: Two times a day (BID) | CUTANEOUS | 6 refills | Status: DC

## 2019-12-25 NOTE — Progress Notes (Signed)
   Follow-Up Visit   Subjective  Ethan Santana is a 69 y.o. male who presents for the following: No chief complaint on file.Marland Kitchen Here for annual exam.   The following portions of the chart were reviewed this encounter and updated as appropriate: Tobacco  Allergies  Meds  Problems  Med Hx  Surg Hx  Fam Hx      Objective  Well appearing patient in no apparent distress; mood and affect are within normal limits.  A full examination was performed including scalp, head, eyes, ears, nose, lips, neck, chest, axillae, abdomen, back, buttocks, bilateral upper extremities, bilateral lower extremities, hands, feet, fingers, toes, fingernails, and toenails. All findings within normal limits unless otherwise noted below.  Objective  total body: Clear today  Objective  Head - to toe: No atypical nevi No signs of non-mole skin cancer.   Objective  Right Temporal Scalp: Erythematous patches with gritty scale.   Assessment & Plan  History of chronic urticaria total body  Continue OTC antihistamines  Screening exam for skin cancer Head - to toe  Yearly skin exams  AK (actinic keratosis) Right Temporal Scalp  Destruction of lesion - Right Temporal Scalp Complexity: simple   Destruction method: cryotherapy   Informed consent: discussed and consent obtained   Timeout:  patient name, date of birth, surgical site, and procedure verified Lesion destroyed using liquid nitrogen: Yes   Outcome: patient tolerated procedure well with no complications      I, Maxum Cassarino, PA-C, have reviewed all documentation's for this visit.  The documentation on 12/25/19 for the exam, diagnosis, procedures and orders are all accurate and complete.

## 2019-12-26 ENCOUNTER — Other Ambulatory Visit: Payer: Self-pay | Admitting: Adult Health

## 2019-12-28 ENCOUNTER — Other Ambulatory Visit: Payer: Self-pay | Admitting: Adult Health

## 2019-12-29 ENCOUNTER — Telehealth: Payer: Self-pay | Admitting: Cardiology

## 2019-12-29 NOTE — Telephone Encounter (Signed)
Patient sent a message to scheduling, he wants to know if his monitor results have been sent to Dr. Rex Kras. Please advise.

## 2019-12-29 NOTE — Telephone Encounter (Signed)
The patient has been made aware that Dr. Judeth Cornfield nurse is out today but will call him back.

## 2019-12-29 NOTE — Telephone Encounter (Signed)
Follow Up:      Pt is returning a call from today. 

## 2019-12-29 NOTE — Telephone Encounter (Signed)
Left a message for the patient to call back.  

## 2019-12-30 NOTE — Telephone Encounter (Signed)
Spoke to pt and informed monitor report has been e-faxed to PCP for review. Pt verbalized understanding.

## 2020-02-24 DIAGNOSIS — H2513 Age-related nuclear cataract, bilateral: Secondary | ICD-10-CM | POA: Diagnosis not present

## 2020-02-24 DIAGNOSIS — H5310 Unspecified subjective visual disturbances: Secondary | ICD-10-CM | POA: Diagnosis not present

## 2020-02-24 DIAGNOSIS — H52203 Unspecified astigmatism, bilateral: Secondary | ICD-10-CM | POA: Diagnosis not present

## 2020-02-24 DIAGNOSIS — H524 Presbyopia: Secondary | ICD-10-CM | POA: Diagnosis not present

## 2020-03-08 ENCOUNTER — Encounter: Payer: Self-pay | Admitting: Adult Health

## 2020-03-08 ENCOUNTER — Ambulatory Visit: Payer: PPO | Admitting: Adult Health

## 2020-03-08 VITALS — BP 118/84 | Ht 67.5 in | Wt 149.8 lb

## 2020-03-08 DIAGNOSIS — H532 Diplopia: Secondary | ICD-10-CM | POA: Diagnosis not present

## 2020-03-08 DIAGNOSIS — G40909 Epilepsy, unspecified, not intractable, without status epilepticus: Secondary | ICD-10-CM | POA: Diagnosis not present

## 2020-03-08 MED ORDER — LEVETIRACETAM 250 MG PO TABS: 250.0000 mg | ORAL_TABLET | Freq: Two times a day (BID) | ORAL | 3 refills | Status: DC

## 2020-03-08 NOTE — Patient Instructions (Signed)
Your Plan:  Continue Keppra  If your symptoms worsen or you develop new symptoms please let us know.    Thank you for coming to see us at [facility]. I hope we have been able to provide you high quality care today.  You may receive a patient satisfaction survey over the next few weeks. We would appreciate your feedback and comments so that we may continue to improve ourselves and the health of our patients.  

## 2020-03-08 NOTE — Progress Notes (Signed)
PATIENT: Ethan Santana DOB: December 15, 1950  REASON FOR VISIT: follow up HISTORY FROM: patient  HISTORY OF PRESENT ILLNESS: Today 03/08/20:  Ethan Santana is a 69 year old male with a history of seizures.  He returns today for follow-up.  At the last visit we switched him from carbamazepine to Keppra.  He states that double vision initially improved but never resolved.  He states that he now has an appointment with neuro-ophthalmology for evaluation.  He denies any seizure events.  Reports that he is tolerating Keppra well.  With his double vision he does not notice any significant weakness in the extremities and denies ptosis.  He returns today for an evaluation.  HISTORY:  08/04/19:  Ethan Santana is a 69 year old male with a history of seizures.  He returns today for follow-up.  He is currently on carbamazepine.  Denies any seizure events.  Reports that 2 weeks ago he decrease his dose of carbamazepine to half a tablet twice a day.  This once due to ongoing double vision.  He reports that his double vision did improve with a decrease in dose but has not resolved.  He did see an ophthalmologist in the distant past that felt that it was due to carbamazepine.  He is interested in trying another medication.  And he is also been on Dilantin in the past.  He states in the past his seizures consisted of him "feeling odd."  He states that he never had a loss of consciousness or convulsing or staring off episodes.  REVIEW OF SYSTEMS: Out of a complete 14 system review of symptoms, the patient complains only of the following symptoms, and all other reviewed systems are negative.  See HPI  ALLERGIES: No Known Allergies  HOME MEDICATIONS: Outpatient Medications Prior to Visit  Medication Sig Dispense Refill  . aspirin-acetaminophen-caffeine (EXCEDRIN MIGRAINE) 250-250-65 MG tablet Take 1 tablet by mouth daily as needed for headache or migraine.    . calcium carbonate (TUMS EX) 750 MG chewable  tablet Chew 1-2 tablets by mouth See admin instructions. 1 tab in the morning, 2 tabs at dinner    . clobetasol (TEMOVATE) 0.05 % external solution Apply 1 application topically 2 (two) times daily. (Patient taking differently: Apply 1 application topically as needed. ) 50 mL 6  . famotidine (PEPCID) 20 MG tablet Take 20 mg by mouth daily.    . fluticasone (FLONASE) 50 MCG/ACT nasal spray Place 1 spray into both nostrils daily.     Marland Kitchen guaiFENesin 200 MG tablet Take 400 mg by mouth 2 (two) times daily as needed for cough or to loosen phlegm.    . levETIRAcetam (KEPPRA) 250 MG tablet TAKE 1 TABLET BY MOUTH TWICE DAILY. 180 tablet 0  . Multiple Vitamins-Minerals (CENTRUM SILVER PO) Take 1 tablet by mouth daily.    . nadolol (CORGARD) 40 MG tablet Take 40 mg by mouth daily.     Marland Kitchen omeprazole (PRILOSEC) 20 MG capsule Take 20 mg by mouth daily as needed (indigestion).     . pseudoephedrine (SUDAFED) 30 MG tablet Take 30 mg by mouth daily as needed for congestion.    . psyllium (REGULOID) 0.52 G capsule Take 0.52 g by mouth 4 (four) times daily.    Marland Kitchen SIMETHICONE PO Take 1 tablet by mouth daily with supper. As needed    . sodium chloride (OCEAN) 0.65 % SOLN nasal spray Place 1 spray into both nostrils as needed for congestion.    . Cholecalciferol (VITAMIN D) 2000 UNITS CAPS  Take 2,000 Units by mouth daily.     No facility-administered medications prior to visit.    PAST MEDICAL HISTORY: Past Medical History:  Diagnosis Date  . ADD (attention deficit disorder)   . Arthritis    "little joint pain in my hands" (05/06/2018)  . Chronic urticaria   . Diplopia 01/11/2015  . GERD (gastroesophageal reflux disease)   . HBP (high blood pressure)   . HNP (herniated nucleus pulposus), lumbar   . Hx of seasonal allergies   . Migraine    "maybe monthly since sinus OR" (05/06/2018)  . Pneumonia 1990s   "once" (05/06/2018)  . Refill clinic medication management patient 01/11/2015  . Temporal lobe epilepsy [facility])     "nobody would ever know it but me" (05/06/2018)  . Wears glasses     PAST SURGICAL HISTORY: Past Surgical History:  Procedure Laterality Date  . BACK SURGERY    . COLONOSCOPY    . LUMBAR LAMINECTOMY/DECOMPRESSION MICRODISCECTOMY N/A 05/06/2018   Procedure: MICRODISCECTOMY LUMBAR ONE- LUMBAR TWO;  Surgeon: Lisbeth Renshaw, MD;  Location: [facility] OR;  Service: Neurosurgery;  Laterality: N/A;  . LUMBAR MICRODISCECTOMY  05/06/2018   L1-L2  . NASAL SINUS SURGERY  2000s  . TONSILLECTOMY    . WISDOM TOOTH EXTRACTION      FAMILY HISTORY: Family History  Problem Relation Age of Onset  . Stroke Mother   . Cancer Mother   . Bone cancer Father     SOCIAL HISTORY: Social History   Socioeconomic History  . Marital status: Married    Spouse name: Jenel Lucks   . Number of children: 2  . Years of education: 12+  . Highest education level: Not on file  Occupational History  . Occupation: Self empolyed   Tobacco Use  . Smoking status: Never Smoker  . Smokeless tobacco: Never Used  Vaping Use  . Vaping Use: Never used  Substance and Sexual Activity  . Alcohol use: Not Currently  . Drug use: Never  . Sexual activity: Yes  Other Topics Concern  . Not on file  Social History Narrative   Patient lives at home wife Jenel Lucks    Have have 2 children.    Patient is right handed.    Patient has his masters   Patient works at home.    Social Determinants of Health   Financial Resource Strain:   . Difficulty of Paying Living Expenses: Not on file  Food Insecurity:   . Worried About Programme researcher, broadcasting/film/video in the Last Year: Not on file  . Ran Out of Food in the Last Year: Not on file  Transportation Needs:   . Lack of Transportation (Medical): Not on file  . Lack of Transportation (Non-Medical): Not on file  Physical Activity:   . Days of Exercise per Week: Not on file  . Minutes of Exercise per Session: Not on file  Stress:   . Feeling of Stress : Not on file  Social Connections:     . Frequency of Communication with Friends and Family: Not on file  . Frequency of Social Gatherings with Friends and Family: Not on file  . Attends Religious Services: Not on file  . Active Member of Clubs or Organizations: Not on file  . Attends Banker Meetings: Not on file  . Marital Status: Not on file  Intimate Partner Violence:   . Fear of Current or Ex-Partner: Not on file  . Emotionally Abused: Not on file  . Physically Abused: Not  on file  . Sexually Abused: Not on file      PHYSICAL EXAM  Vitals:   03/08/20 1306  BP: 118/84  Weight: 149 lb 12.8 oz (67.9 kg)  Height: 5' 7.5" (1.715 m)   Body mass index is 23.12 kg/m.  Generalized: Well developed, in no acute distress   Neurological examination  Mentation: Alert oriented to time, place, history taking. Follows all commands speech and language fluent Cranial nerve II-XII: Pupils were equal round reactive to light. Extraocular movements were full, visual field were full on confrontational test. Facial sensation and strength were normal. Uvula tongue midline. Head turning and shoulder shrug  were normal and symmetric. Motor: The motor testing reveals 5 over 5 strength of all 4 extremities. Good symmetric motor tone is noted throughout.  Sensory: Sensory testing is intact to soft touch on all 4 extremities. No evidence of extinction is noted.  Coordination: Cerebellar testing reveals good finger-nose-finger and heel-to-shin bilaterally.  Gait and station: Gait is normal. Tandem gait is normal. Romberg is negative. No drift is seen.  Reflexes: Deep tendon reflexes are symmetric and normal bilaterally.   DIAGNOSTIC DATA (LABS, IMAGING, TESTING) - I reviewed patient records, labs, notes, testing and imaging myself where available.  Lab Results  Component Value Date   WBC 5.5 12/29/2018   HGB 17.2 12/29/2018   HCT 48.4 12/29/2018   MCV 87 12/29/2018   PLT 155 12/29/2018      Component Value Date/Time    NA 137 12/29/2018 1418   K 5.3 (H) 12/29/2018 1418   CL 96 12/29/2018 1418   CO2 25 12/29/2018 1418   GLUCOSE 96 12/29/2018 1418   GLUCOSE 105 (H) 05/06/2018 0810   BUN 23 12/29/2018 1418   CREATININE 1.06 12/29/2018 1418   CALCIUM 9.6 12/29/2018 1418   PROT 6.9 12/29/2018 1418   ALBUMIN 4.8 12/29/2018 1418   AST 13 12/29/2018 1418   ALT 11 12/29/2018 1418   ALKPHOS 111 12/29/2018 1418   BILITOT 0.4 12/29/2018 1418   GFRNONAA 73 12/29/2018 1418   GFRAA 84 12/29/2018 1418      ASSESSMENT AND PLAN 69 y.o. year old male  has a past medical history of ADD (attention deficit disorder), Arthritis, Chronic urticaria, Diplopia (01/11/2015), GERD (gastroesophageal reflux disease), HBP (high blood pressure), HNP (herniated nucleus pulposus), lumbar, seasonal allergies, Migraine, Pneumonia (1990s), Refill clinic medication management patient (01/11/2015), Temporal lobe epilepsy (HCC), and Wears glasses. here with :  1.  Seizures  -Continue Keppra 250 mg twice a day -Advised if he has any seizure events he should let us know -Follow-up in 1 year or sooner if needed  I spent 25 minutes of face-to-face and non-face-to-face time with patient.  This included previsit chart review, lab review, study review, order entry, electronic health record documentation, patient education.  Butch Penny, MSN, NP-C 03/08/2020, 1:12 PM [facility] [address] [place], [place] [address] [phone]

## 2020-05-25 DIAGNOSIS — S76111A Strain of right quadriceps muscle, fascia and tendon, initial encounter: Secondary | ICD-10-CM | POA: Diagnosis not present

## 2020-05-25 DIAGNOSIS — W000XXA Fall on same level due to ice and snow, initial encounter: Secondary | ICD-10-CM | POA: Diagnosis not present

## 2020-06-30 DIAGNOSIS — Z136 Encounter for screening for cardiovascular disorders: Secondary | ICD-10-CM | POA: Diagnosis not present

## 2020-06-30 DIAGNOSIS — Z125 Encounter for screening for malignant neoplasm of prostate: Secondary | ICD-10-CM | POA: Diagnosis not present

## 2020-06-30 DIAGNOSIS — Z0000 Encounter for general adult medical examination without abnormal findings: Secondary | ICD-10-CM | POA: Diagnosis not present

## 2020-06-30 DIAGNOSIS — Z131 Encounter for screening for diabetes mellitus: Secondary | ICD-10-CM | POA: Diagnosis not present

## 2020-07-08 DIAGNOSIS — Z0000 Encounter for general adult medical examination without abnormal findings: Secondary | ICD-10-CM | POA: Diagnosis not present

## 2020-07-08 DIAGNOSIS — D696 Thrombocytopenia, unspecified: Secondary | ICD-10-CM | POA: Diagnosis not present

## 2020-07-08 DIAGNOSIS — I10 Essential (primary) hypertension: Secondary | ICD-10-CM | POA: Diagnosis not present

## 2020-08-03 DIAGNOSIS — H532 Diplopia: Secondary | ICD-10-CM | POA: Diagnosis not present

## 2020-08-10 DIAGNOSIS — H532 Diplopia: Secondary | ICD-10-CM | POA: Diagnosis not present

## 2020-08-10 DIAGNOSIS — E0500 Thyrotoxicosis with diffuse goiter without thyrotoxic crisis or storm: Secondary | ICD-10-CM | POA: Diagnosis not present

## 2020-10-05 DIAGNOSIS — H5213 Myopia, bilateral: Secondary | ICD-10-CM | POA: Diagnosis not present

## 2020-10-05 DIAGNOSIS — H5022 Vertical strabismus, left eye: Secondary | ICD-10-CM | POA: Diagnosis not present

## 2020-10-05 DIAGNOSIS — H532 Diplopia: Secondary | ICD-10-CM | POA: Diagnosis not present

## 2020-10-05 DIAGNOSIS — H52223 Regular astigmatism, bilateral: Secondary | ICD-10-CM | POA: Diagnosis not present

## 2020-10-05 DIAGNOSIS — R972 Elevated prostate specific antigen [PSA]: Secondary | ICD-10-CM | POA: Diagnosis not present

## 2020-10-05 DIAGNOSIS — E039 Hypothyroidism, unspecified: Secondary | ICD-10-CM | POA: Diagnosis not present

## 2021-01-17 DIAGNOSIS — H5022 Vertical strabismus, left eye: Secondary | ICD-10-CM | POA: Diagnosis not present

## 2021-02-23 DIAGNOSIS — H52203 Unspecified astigmatism, bilateral: Secondary | ICD-10-CM | POA: Diagnosis not present

## 2021-02-23 DIAGNOSIS — H532 Diplopia: Secondary | ICD-10-CM | POA: Diagnosis not present

## 2021-02-23 DIAGNOSIS — H2513 Age-related nuclear cataract, bilateral: Secondary | ICD-10-CM | POA: Diagnosis not present

## 2021-02-28 ENCOUNTER — Encounter (HOSPITAL_BASED_OUTPATIENT_CLINIC_OR_DEPARTMENT_OTHER): Payer: Self-pay

## 2021-03-08 ENCOUNTER — Ambulatory Visit: Payer: PPO | Admitting: Adult Health

## 2021-03-08 ENCOUNTER — Encounter: Payer: Self-pay | Admitting: Adult Health

## 2021-03-08 ENCOUNTER — Other Ambulatory Visit: Payer: Self-pay

## 2021-03-08 VITALS — BP 122/79 | HR 65 | Ht 68.0 in | Wt 140.2 lb

## 2021-03-08 DIAGNOSIS — G40909 Epilepsy, unspecified, not intractable, without status epilepticus: Secondary | ICD-10-CM | POA: Diagnosis not present

## 2021-03-08 NOTE — Progress Notes (Signed)
PATIENT: Ethan Santana DOB: 02-09-1951  REASON FOR VISIT: follow up HISTORY FROM: patient  HISTORY OF PRESENT ILLNESS: Today 03/08/21:  Ethan Santana is a 70 year old male with a history of seizures.  He returns today for follow-up.  The patient states that he reduce his dose of Keppra 225 mg daily.  He states that he always has events where he feels "a little off."  States that he may hear a different sound of music but only last for like 2 seconds.  He has never lost consciousness with his seizure type events.  He has not had a recent EEG through our office.  He returns today for an evaluation.  Ethan Santana is a 70 year old male with a history of seizures.  He returns today for follow-up.  At the last visit we switched him from carbamazepine to Keppra.  He states that double vision initially improved but never resolved.  He states that he now has an appointment with neuro-ophthalmology for evaluation.  He denies any seizure events.  Reports that he is tolerating Keppra well.  With his double vision he does not notice any significant weakness in the extremities and denies ptosis.  He returns today for an evaluation.  HISTORY:  08/04/19:   Ethan Santana is a 70 year old male with a history of seizures.  He returns today for follow-up.  He is currently on carbamazepine.  Denies any seizure events.  Reports that 2 weeks ago he decrease his dose of carbamazepine to half a tablet twice a day.  This once due to ongoing double vision.  He reports that his double vision did improve with a decrease in dose but has not resolved.  He did see an ophthalmologist in the distant past that felt that it was due to carbamazepine.  He is interested in trying another medication.  And he is also been on Dilantin in the past.  He states in the past his seizures consisted of him "feeling odd."  He states that he never had a loss of consciousness or convulsing or staring off episodes.  REVIEW OF SYSTEMS: Out of a  complete 14 system review of symptoms, the patient complains only of the following symptoms, and all other reviewed systems are negative.  See HPI  ALLERGIES: No Known Allergies  HOME MEDICATIONS: Outpatient Medications Prior to Visit  Medication Sig Dispense Refill   aspirin-acetaminophen-caffeine (EXCEDRIN MIGRAINE) 250-250-65 MG tablet Take 1 tablet by mouth daily as needed for headache or migraine.     calcium carbonate (TUMS EX) 750 MG chewable tablet Chew 1-2 tablets by mouth See admin instructions. 1 tab in the morning, 2 tabs at dinner     famotidine (PEPCID) 20 MG tablet Take 20 mg by mouth daily.     fluticasone (FLONASE) 50 MCG/ACT nasal spray Place 1 spray into both nostrils daily.      guaiFENesin 200 MG tablet Take 400 mg by mouth 2 (two) times daily as needed for cough or to loosen phlegm.     levETIRAcetam (KEPPRA) 250 MG tablet Take 1 tablet (250 mg total) by mouth 2 (two) times daily. (Patient taking differently: Take 250 mg by mouth daily. Pt is taking 1/2 once a day) 180 tablet 3   Multiple Vitamins-Minerals (CENTRUM SILVER PO) Take 1 tablet by mouth daily.     nadolol (CORGARD) 40 MG tablet Take 40 mg by mouth daily.      omeprazole (PRILOSEC) 20 MG capsule Take 20 mg by mouth daily as needed (indigestion).  pseudoephedrine (SUDAFED) 30 MG tablet Take 30 mg by mouth daily as needed for congestion.     psyllium (REGULOID) 0.52 G capsule Take 0.52 g by mouth 2 (two) times daily. I capsule 2x daily     SIMETHICONE PO Take 1 tablet by mouth daily with supper. As needed     sodium chloride (OCEAN) 0.65 % SOLN nasal spray Place 1 spray into both nostrils as needed for congestion.     clobetasol (TEMOVATE) 0.05 % external solution Apply 1 application topically 2 (two) times daily. (Patient taking differently: Apply 1 application topically as needed.) 50 mL 6   No facility-administered medications prior to visit.    PAST MEDICAL HISTORY: Past Medical History:  Diagnosis  Date   ADD (attention deficit disorder)    Arthritis    "little joint pain in my hands" (05/06/2018)   Chronic urticaria    Diplopia 01/11/2015   GERD (gastroesophageal reflux disease)    HBP (high blood pressure)    HNP (herniated nucleus pulposus), lumbar    Hx of seasonal allergies    Migraine    "maybe monthly since sinus OR" (05/06/2018)   Pneumonia 1990s   "once" (05/06/2018)   Refill clinic medication management patient 01/11/2015   Temporal lobe epilepsy [facility])    "nobody would ever know it but me" (05/06/2018)   Wears glasses     PAST SURGICAL HISTORY: Past Surgical History:  Procedure Laterality Date   BACK SURGERY     COLONOSCOPY     LUMBAR LAMINECTOMY/DECOMPRESSION MICRODISCECTOMY N/A 05/06/2018   Procedure: MICRODISCECTOMY LUMBAR ONE- LUMBAR TWO;  Surgeon: Lisbeth Renshaw, MD;  Location: [facility] OR;  Service: Neurosurgery;  Laterality: N/A;   LUMBAR MICRODISCECTOMY  05/06/2018   L1-L2   NASAL SINUS SURGERY  2000s   TONSILLECTOMY     WISDOM TOOTH EXTRACTION      FAMILY HISTORY: Family History  Problem Relation Age of Onset   Stroke Mother    Cancer Mother    Bone cancer Father    Seizures Neg Hx     SOCIAL HISTORY: Social History   Socioeconomic History   Marital status: Married    Spouse name: Jenel Lucks    Number of children: 2   Years of education: 12+   Highest education level: Not on file  Occupational History   Occupation: Self empolyed   Tobacco Use   Smoking status: Never   Smokeless tobacco: Never  Vaping Use   Vaping Use: Never used  Substance and Sexual Activity   Alcohol use: Not Currently   Drug use: Never   Sexual activity: Yes  Other Topics Concern   Not on file  Social History Narrative   Patient lives at home wife Jenel Lucks    Have have 2 children.    Patient is right handed.    Patient has his masters   Patient works at home.    Social Determinants of Health   Financial Resource Strain: Not on file  Food Insecurity: Not on  file  Transportation Needs: Not on file  Physical Activity: Not on file  Stress: Not on file  Social Connections: Not on file  Intimate Partner Violence: Not on file      PHYSICAL EXAM  Vitals:   03/08/21 1402  BP: 122/79  Pulse: 65  Weight: 140 lb 3.2 oz (63.6 kg)  Height: 5\' 8"  (1.727 m)   Body mass index is 21.32 kg/m.  Generalized: Well developed, in no acute distress   Neurological examination  Mentation: Alert oriented to time, place, history taking. Follows all commands speech and language fluent Cranial nerve II-XII: Pupils were equal round reactive to light. Extraocular movements were full, visual field were full on confrontational test. Facial sensation and strength were normal. Uvula tongue midline. Head turning and shoulder shrug  were normal and symmetric. Motor: The motor testing reveals 5 over 5 strength of all 4 extremities. Good symmetric motor tone is noted throughout.  Sensory: Sensory testing is intact to soft touch on all 4 extremities. No evidence of extinction is noted.  Coordination: Cerebellar testing reveals good finger-nose-finger and heel-to-shin bilaterally.  Gait and station: Gait is normal. Tandem gait is normal. Romberg is negative. No drift is seen.  Reflexes: Deep tendon reflexes are symmetric and normal bilaterally.   DIAGNOSTIC DATA (LABS, IMAGING, TESTING) - I reviewed patient records, labs, notes, testing and imaging myself where available.  Lab Results  Component Value Date   WBC 5.5 12/29/2018   HGB 17.2 12/29/2018   HCT 48.4 12/29/2018   MCV 87 12/29/2018   PLT 155 12/29/2018      Component Value Date/Time   NA 137 12/29/2018 1418   K 5.3 (H) 12/29/2018 1418   CL 96 12/29/2018 1418   CO2 25 12/29/2018 1418   GLUCOSE 96 12/29/2018 1418   GLUCOSE 105 (H) 05/06/2018 0810   BUN 23 12/29/2018 1418   CREATININE 1.06 12/29/2018 1418   CALCIUM 9.6 12/29/2018 1418   PROT 6.9 12/29/2018 1418   ALBUMIN 4.8 12/29/2018 1418   AST 13  12/29/2018 1418   ALT 11 12/29/2018 1418   ALKPHOS 111 12/29/2018 1418   BILITOT 0.4 12/29/2018 1418   GFRNONAA 73 12/29/2018 1418   GFRAA 84 12/29/2018 1418      ASSESSMENT AND PLAN 70 y.o. year old male  has a past medical history of ADD (attention deficit disorder), Arthritis, Chronic urticaria, Diplopia (01/11/2015), GERD (gastroesophageal reflux disease), HBP (high blood pressure), HNP (herniated nucleus pulposus), lumbar, seasonal allergies, Migraine, Pneumonia (1990s), Refill clinic medication management patient (01/11/2015), Temporal lobe epilepsy ([facility]), and Wears glasses. here with :  1.  Seizures  -Continue Keppra 125 mg for now. Patient reduced dose on his own. -Advised if he has any seizure events he should let us know - Discussed weaning off Keppra with Dr. Vickey Huger (patient requested). Dr. Vickey Huger advised patient would need to stop driving for 6 months if he stops Keppra. Repeat EEG. If he doesn't want to do that then recommend continue Keppra 250 mg BID. -Follow-up in 1 year or sooner if needed   Butch Penny, MSN, NP-C 03/08/2021, 2:31 PM [facility] [address], [address] [place], [place] [address] [phone]

## 2021-03-09 ENCOUNTER — Telehealth: Payer: Self-pay | Admitting: *Deleted

## 2021-03-09 NOTE — Telephone Encounter (Signed)
I called patient.  I relayed that Ethan Santana I did speak with Dr. Brett Fairy regarding his Keppra dose.  He is on 125 mg daily.  His original dose was Keppra 250 mg twice a day.  I relayed that per Dr. Brett Fairy she recommended EEG and no driving for 6 months if he wanted to come off the medication.  He did not have seizures, no LOC just states he did not feel right.  If he did not want to do that then they recommended that he take Keppra to 250 mg p.o. twice daily.  Patient stated this did not make sense.  He wanted to speak or send an email to Dr. Brett Fairy.  I relayed that was fine.  MM/NP aware.

## 2021-03-14 ENCOUNTER — Ambulatory Visit: Payer: PPO | Admitting: Physician Assistant

## 2021-03-14 ENCOUNTER — Encounter: Payer: Self-pay | Admitting: Physician Assistant

## 2021-03-14 ENCOUNTER — Other Ambulatory Visit: Payer: Self-pay

## 2021-03-14 DIAGNOSIS — L570 Actinic keratosis: Secondary | ICD-10-CM

## 2021-03-14 DIAGNOSIS — Z1283 Encounter for screening for malignant neoplasm of skin: Secondary | ICD-10-CM | POA: Diagnosis not present

## 2021-03-17 NOTE — Progress Notes (Signed)
   Follow-Up Visit   Subjective  Ethan Santana is a 70 y.o. male who presents for the following: Annual Exam (No new concerns- No family/ Personal history of melanoma or non mole skin cancer.).   The following portions of the chart were reviewed this encounter and updated as appropriate:      Objective  Well appearing patient in no apparent distress; mood and affect are within normal limits.  All skin waist up examined.  Left Forehead, Mid Frontal Scalp (2) Erythematous patches with gritty scale.   Assessment & Plan  AK (actinic keratosis) (3) Mid Frontal Scalp (2); Left Forehead  Patient also has sgbp on the forehead raised lesions that are safe to leave.   Destruction of lesion - Left Forehead, Mid Frontal Scalp Complexity: simple   Destruction method: cryotherapy   Informed consent: discussed and consent obtained   Timeout:  patient name, date of birth, surgical site, and procedure verified Lesion destroyed using liquid nitrogen: Yes   Cryotherapy cycles:  3 Outcome: patient tolerated procedure well with no complications   Post-procedure details: wound care instructions given      I, Shareka Casale, PA-C, have reviewed all documentation's for this visit.  The documentation on 03/17/21 for the exam, diagnosis, procedures and orders are all accurate and complete.

## 2021-03-20 ENCOUNTER — Other Ambulatory Visit: Payer: Self-pay

## 2021-03-20 ENCOUNTER — Encounter (HOSPITAL_BASED_OUTPATIENT_CLINIC_OR_DEPARTMENT_OTHER): Payer: Self-pay | Admitting: Cardiology

## 2021-03-20 ENCOUNTER — Ambulatory Visit (HOSPITAL_BASED_OUTPATIENT_CLINIC_OR_DEPARTMENT_OTHER): Payer: PPO | Admitting: Cardiology

## 2021-03-20 VITALS — BP 130/92 | HR 70 | Ht 68.0 in | Wt 137.2 lb

## 2021-03-20 DIAGNOSIS — R002 Palpitations: Secondary | ICD-10-CM

## 2021-03-20 DIAGNOSIS — I10 Essential (primary) hypertension: Secondary | ICD-10-CM

## 2021-03-20 DIAGNOSIS — Z7189 Other specified counseling: Secondary | ICD-10-CM

## 2021-03-20 NOTE — Progress Notes (Signed)
Cardiology Office Note:    Date:  03/22/2021   ID:  Ethan Santana, DOB Sep 14, 1950, MRN [id number]  PCP:  Ethan Pilon, FNP  Cardiologist:  Ethan Red, MD  Referring MD: Ethan Gosselin, MD   CC: Follow-up  History of Present Illness:    Ethan Santana is a 70 y.o. male with a hx of seizure disorder, hypertension, ADD, lumbar spinal stenosis who is seen for follow-up. I initially met him 07/09/2019 as a new consult at Ethan request of Santana, Ethan Bee, MD for Ethan evaluation and management of palpitations.  Today: Overall, he is feeling fine. Since his last visit, he was experiencing more severe episodes of palpitations for a few months. Ethan episodes occurred a couple times a day every few hours, with a typical duration of a few minutes. During Ethan episodes he would feel a hard heartbeat every 2-3 beats. He denies feeling chest pain, but sometimes has feelings of nervousness.  Lately, his palpitations have been milder and may last only seconds. Yesterday this occurred once, and he had a few mild palpitations in clinic today.  At home his blood pressure is generally well-controlled.  He denies any shortness of breath. No lightheadedness, headaches, syncope, orthopnea, PND, lower extremity edema Santana exertional symptoms.   Past Medical History:  Diagnosis Date   ADD (attention deficit disorder)    Arthritis    "Santana joint pain in my hands" (05/06/2018)   Chronic urticaria    Diplopia 01/11/2015   GERD (gastroesophageal reflux disease)    HBP (high blood pressure)    HNP (herniated nucleus pulposus), lumbar    Hx of seasonal allergies    Migraine    "maybe monthly since sinus Santana" (05/06/2018)   Pneumonia 1990s   "once" (05/06/2018)   Refill clinic medication management patient 01/11/2015   Temporal lobe epilepsy [facility])    "nobody would ever know it but me" (05/06/2018)   Wears glasses     Past Surgical History:  Procedure Laterality Date   BACK SURGERY      COLONOSCOPY     LUMBAR LAMINECTOMY/DECOMPRESSION MICRODISCECTOMY N/A 05/06/2018   Procedure: MICRODISCECTOMY LUMBAR ONE- LUMBAR TWO;  Surgeon: Ethan Renshaw, MD;  Location: Ethan Santana;  Service: Neurosurgery;  Laterality: N/A;   LUMBAR MICRODISCECTOMY  05/06/2018   L1-L2   NASAL SINUS SURGERY  2000s   TONSILLECTOMY     WISDOM TOOTH EXTRACTION      Current Medications: Current Outpatient Medications on File Prior to Visit  Medication Sig   aspirin-acetaminophen-caffeine (EXCEDRIN MIGRAINE) 250-250-65 MG tablet Take 1 tablet by mouth daily as needed for headache Santana migraine.   calcium carbonate (TUMS EX) 750 MG chewable tablet Chew 1-2 tablets by mouth as needed. 1 tab in Ethan morning, 2 tabs at dinner   clobetasol (TEMOVATE) 0.05 % external solution Apply 1 application topically 2 (two) times daily. (Patient taking differently: Apply 1 application topically as needed.)   famotidine (PEPCID) 20 MG tablet Take 20 mg by mouth daily.   fluticasone (FLONASE) 50 MCG/ACT nasal spray Place 1 spray into both nostrils daily.    guaiFENesin 200 MG tablet Take 400 mg by mouth 2 (two) times daily as needed for cough Santana to loosen phlegm.   Multiple Vitamins-Minerals (CENTRUM SILVER PO) Take 1 tablet by mouth daily.   omeprazole (PRILOSEC) 20 MG capsule Take 20 mg by mouth daily as needed (indigestion).    pseudoephedrine (SUDAFED) 30 MG tablet Take 30 mg by mouth daily as needed for congestion.  psyllium (REGULOID) 0.52 G capsule Take 0.52 g by mouth 2 (two) times daily. I capsule 2x daily   SIMETHICONE PO Take 1 tablet by mouth daily with supper. As needed   sodium chloride (OCEAN) 0.65 % SOLN nasal spray Place 1 spray into both nostrils as needed for congestion.   No current facility-administered medications on file prior to visit.     Allergies:   Patient has no known allergies.   Social History   Tobacco Use   Smoking status: Never   Smokeless tobacco: Never  Vaping Use   Vaping Use: Never  used  Substance Use Topics   Alcohol use: Not Currently   Drug use: Never    Family History: family history includes Bone cancer in his father; Cancer in his mother; Stroke in his mother. There is no history of Seizures. FH: mother had afib. Father had 5V CABG in his 25s-80s.   ROS:   Please see Ethan history of present illness. (+) Palpitations All other systems are reviewed and negative.    EKGs/Labs/Other Studies Reviewed:    Ethan following studies were reviewed today:  Monitor 10/2019-11/2019 14 days of data recorded on Zio monitor. Patient had a min HR of 47 bpm, max HR of 139 bpm, and avg HR of 65 bpm. Predominant underlying rhythm was Sinus Rhythm. No VT, SVT, atrial fibrillation, high degree block, Santana pauses noted. Monitor reports one episode of SVT, but this is actually a PAC triplet. Isolated atrial and ventricular ectopy was rare (<1%). There were 7 triggered events. These were sinus rhythm, with Santana without PAC. No significant arrhythmias detected.  EKG:  EKG is personally reviewed.   03/20/2021: NSR at 70 bpm 07/09/2019: NSR  Recent Labs: No results found for requested labs within last 8760 hours.   Recent Lipid Panel No results found for: CHOL, TRIG, HDL, CHOLHDL, VLDL, LDLCALC, LDLDIRECT  Physical Exam:    VS:  BP (!) 130/92   Pulse 70   Ht 5\' 8"  (1.727 m)   Wt 137 lb 3.2 oz (62.2 kg)   SpO2 98%   BMI 20.86 kg/m     Wt Readings from Last 3 Encounters:  03/20/21 137 lb 3.2 oz (62.2 kg)  03/08/21 140 lb 3.2 oz (63.6 kg)  03/08/20 149 lb 12.8 oz (67.9 kg)    GEN: Well nourished, well developed in no acute distress HEENT: Normal, moist mucous membranes NECK: No JVD CARDIAC: regular rhythm, normal S1 and S2, no rubs Santana gallops. No murmurs. VASCULAR: Radial and DP pulses 2+ bilaterally. No carotid bruits RESPIRATORY:  Clear to auscultation without rales, wheezing Santana rhonchi  ABDOMEN: Soft, non-tender, non-distended MUSCULOSKELETAL:  Ambulates  independently SKIN: Warm and dry, no edema NEUROLOGIC:  Alert and oriented x 3. No focal neuro deficits noted. PSYCHIATRIC:  Normal affect    ASSESSMENT:    1. Palpitation   2. Essential hypertension   3. Cardiac risk counseling   4. Counseling on health promotion and disease prevention     PLAN:    Palpitations:  -had resolved but then had recent recurrence. Now resolved again. -we discussed Kardia mobile today -discussed Santana flag warning signs that need immediate medical attention -discussed potential triggers. He will watch for patterns -ECG normal sinus rhythm today -if symptoms recur/worsen, he will contact me and we will send a monitor  Hypertension:  -systolic at goal, diastolic elevated -no longer taking nadolol -he will continue to monitor  Cardiac risk counseling and prevention recommendations: for primary prevention -recommend  heart healthy/Mediterranean diet, with whole grains, fruits, vegetable, fish, lean meats, nuts, and olive oil. Limit salt. -recommend moderate walking, 3-5 times/week for 30-50 minutes each session. Aim for at least 150 minutes.week. Goal should be pace of 3 miles/hours, Santana walking 1.5 miles in 30 minutes  Plan for follow up: PRN  Ethan Red, MD, PhD [place]  [facility]    Medication Adjustments/Labs and Tests Ordered: Current medicines are reviewed at length with Ethan patient today.  Concerns regarding medicines are outlined above.   Orders Placed This Encounter  Procedures   EKG 12-Lead    No orders of Ethan defined types were placed in this encounter.  Patient Instructions  Medication Instructions:  Your Physician recommend you continue on your current medication as directed.    *If you need a refill on your cardiac medications before your next appointment, please call your pharmacy*   Lab Work: None ordered today   Testing/Procedures: None ordered today   Follow-Up: At [facility], you and your  health needs are our priority.  As part of our continuing mission to provide you with exceptional heart care, we have created designated Provider Care Teams.  These Care Teams include your primary Cardiologist (physician) and Advanced Practice Providers (APPs -  Physician Assistants and Nurse Practitioners) who all work together to provide you with Ethan care you need, when you need it.  We recommend signing up for Ethan patient portal called "MyChart".  Sign up information is provided on this After Visit Summary.  MyChart is used to connect with patients for Virtual Visits (Telemedicine).  Patients are able to view lab/test results, encounter notes, upcoming appointments, etc.  Non-urgent messages can be sent to your provider as well.   To learn more about what you can do with MyChart, go to [email or web address].    Your next appointment:   As needed  Ethan format for your next appointment:   In Person  Provider:   Jodelle Red, MD     [facility],acting as a scribe for Ethan Red, MD.,have documented all relevant documentation on Ethan behalf of Ethan Red, MD,as directed by  Ethan Red, MD while in Ethan presence of Ethan Red, MD.  I, Ethan Red, MD, have reviewed all documentation for this visit. Ethan documentation on 03/22/21 for Ethan exam, diagnosis, procedures, and orders are all accurate and complete.   Signed, Ethan Red, MD PhD 03/22/2021 8:40 AM    [facility]

## 2021-03-20 NOTE — Patient Instructions (Signed)

## 2021-03-22 ENCOUNTER — Encounter (HOSPITAL_BASED_OUTPATIENT_CLINIC_OR_DEPARTMENT_OTHER): Payer: Self-pay | Admitting: Cardiology

## 2021-04-07 ENCOUNTER — Encounter (HOSPITAL_BASED_OUTPATIENT_CLINIC_OR_DEPARTMENT_OTHER): Payer: Self-pay

## 2021-04-10 NOTE — Telephone Encounter (Signed)
FYI

## 2021-05-16 ENCOUNTER — Encounter (HOSPITAL_BASED_OUTPATIENT_CLINIC_OR_DEPARTMENT_OTHER): Payer: Self-pay

## 2021-05-17 NOTE — Telephone Encounter (Signed)
Please advise if needed.

## 2021-05-26 NOTE — Telephone Encounter (Signed)
Patient following up on EKG sent in

## 2021-05-29 NOTE — Telephone Encounter (Signed)
Looks like you were messaging this patient before!

## 2021-06-26 ENCOUNTER — Encounter (HOSPITAL_BASED_OUTPATIENT_CLINIC_OR_DEPARTMENT_OTHER): Payer: Self-pay

## 2021-06-27 NOTE — Telephone Encounter (Signed)
Follow up for you!

## 2021-06-27 NOTE — Telephone Encounter (Signed)
He wore ZIO last time. Can either wear another ZIO and ensure letting the alcohol dry all the way before applying or okay to send Preventice for 14 days.   Loel Dubonnet, NP

## 2021-07-03 NOTE — Telephone Encounter (Signed)
Agree with trial of Zio or preventice. The longer he can wear it, the more information, but the most important thing is to press the button with symptomatic events so we can determine the correlation between the symptoms and the rhythm

## 2021-07-06 DIAGNOSIS — R7301 Impaired fasting glucose: Secondary | ICD-10-CM | POA: Diagnosis not present

## 2021-07-06 DIAGNOSIS — Z125 Encounter for screening for malignant neoplasm of prostate: Secondary | ICD-10-CM | POA: Diagnosis not present

## 2021-07-06 DIAGNOSIS — D582 Other hemoglobinopathies: Secondary | ICD-10-CM | POA: Diagnosis not present

## 2021-07-06 DIAGNOSIS — G40909 Epilepsy, unspecified, not intractable, without status epilepticus: Secondary | ICD-10-CM | POA: Diagnosis not present

## 2021-07-06 DIAGNOSIS — D696 Thrombocytopenia, unspecified: Secondary | ICD-10-CM | POA: Diagnosis not present

## 2021-07-06 DIAGNOSIS — I10 Essential (primary) hypertension: Secondary | ICD-10-CM | POA: Diagnosis not present

## 2021-07-06 DIAGNOSIS — E039 Hypothyroidism, unspecified: Secondary | ICD-10-CM | POA: Diagnosis not present

## 2021-07-06 DIAGNOSIS — E559 Vitamin D deficiency, unspecified: Secondary | ICD-10-CM | POA: Diagnosis not present

## 2021-07-06 DIAGNOSIS — Z0000 Encounter for general adult medical examination without abnormal findings: Secondary | ICD-10-CM | POA: Diagnosis not present

## 2021-07-12 DIAGNOSIS — E559 Vitamin D deficiency, unspecified: Secondary | ICD-10-CM | POA: Diagnosis not present

## 2021-07-12 DIAGNOSIS — K219 Gastro-esophageal reflux disease without esophagitis: Secondary | ICD-10-CM | POA: Diagnosis not present

## 2021-07-12 DIAGNOSIS — G40909 Epilepsy, unspecified, not intractable, without status epilepticus: Secondary | ICD-10-CM | POA: Diagnosis not present

## 2021-07-12 DIAGNOSIS — D696 Thrombocytopenia, unspecified: Secondary | ICD-10-CM | POA: Diagnosis not present

## 2021-07-12 DIAGNOSIS — E039 Hypothyroidism, unspecified: Secondary | ICD-10-CM | POA: Diagnosis not present

## 2021-07-12 DIAGNOSIS — Z0000 Encounter for general adult medical examination without abnormal findings: Secondary | ICD-10-CM | POA: Diagnosis not present

## 2021-07-12 DIAGNOSIS — I10 Essential (primary) hypertension: Secondary | ICD-10-CM | POA: Diagnosis not present

## 2021-07-12 DIAGNOSIS — R0981 Nasal congestion: Secondary | ICD-10-CM | POA: Diagnosis not present

## 2021-07-19 DIAGNOSIS — E041 Nontoxic single thyroid nodule: Secondary | ICD-10-CM | POA: Diagnosis not present

## 2021-07-19 DIAGNOSIS — E039 Hypothyroidism, unspecified: Secondary | ICD-10-CM | POA: Diagnosis not present

## 2021-12-08 ENCOUNTER — Encounter (HOSPITAL_BASED_OUTPATIENT_CLINIC_OR_DEPARTMENT_OTHER): Payer: Self-pay | Admitting: Emergency Medicine

## 2021-12-08 ENCOUNTER — Emergency Department (HOSPITAL_BASED_OUTPATIENT_CLINIC_OR_DEPARTMENT_OTHER)
Admission: EM | Admit: 2021-12-08 | Discharge: 2021-12-09 | Disposition: A | Discharge status: Home / Self Care | Payer: PPO | Attending: Emergency Medicine | Admitting: Emergency Medicine

## 2021-12-08 ENCOUNTER — Other Ambulatory Visit: Payer: Self-pay

## 2021-12-08 DIAGNOSIS — Z7982 Long term (current) use of aspirin: Secondary | ICD-10-CM | POA: Diagnosis not present

## 2021-12-08 DIAGNOSIS — H539 Unspecified visual disturbance: Secondary | ICD-10-CM

## 2021-12-08 DIAGNOSIS — I10 Essential (primary) hypertension: Secondary | ICD-10-CM | POA: Diagnosis not present

## 2021-12-08 DIAGNOSIS — H5711 Ocular pain, right eye: Secondary | ICD-10-CM | POA: Diagnosis not present

## 2021-12-08 NOTE — ED Triage Notes (Signed)
Pt presents for appearance of floater in that he believes is from his R eye (can still see the bright spot even when closing one eye) starting today. No dizziness or difficulty with ambulation.  Painless. Wears glasses. Denies fall or trauma to head.  Denies DM H/o HTN

## 2021-12-08 NOTE — ED Provider Notes (Signed)
[facility] Provider Note   CSN: [id number] Arrival date & time: 12/08/21  1934     History  Chief Complaint  Patient presents with   Eye Pain    R eye pain, poss. Retinal tear.     Ethan Santana is a 71 y.o. male.  Patient is a 71 year old male with past medical history of hypertension.  Patient presenting today with complaints of visual disturbances.  He reports looking at his cell phone earlier this evening when he describes a spot in his right eye.  He describes a "squiggly light" that lasted for several hours, then resolved spontaneously.  He did describe some eye discomfort, but denies any injury or trauma.  Symptoms lasted for approximately 4 hours and have since resolved.  He has no complaints of headache, dizziness, weakness.  The history is provided by the patient.       Home Medications Prior to Admission medications   Medication Sig Start Date End Date Taking? Authorizing Provider  aspirin-acetaminophen-caffeine (EXCEDRIN MIGRAINE) [phone] MG tablet Take 1 tablet by mouth daily as needed for headache or migraine.    [provider]  calcium carbonate (TUMS EX) 750 MG chewable tablet Chew 1-2 tablets by mouth as needed. 1 tab in the morning, 2 tabs at dinner    [provider]  clobetasol (TEMOVATE) 0.05 % external solution Apply 1 application topically 2 (two) times daily. Patient taking differently: Apply 1 application topically as needed. 12/25/19   Sheffield, Ronalee Red, PA-C  famotidine (PEPCID) 20 MG tablet Take 20 mg by mouth daily.    [provider]  fluticasone (FLONASE) 50 MCG/ACT nasal spray Place 1 spray into both nostrils daily.  11/30/12   [provider]  guaiFENesin 200 MG tablet Take 400 mg by mouth 2 (two) times daily as needed for cough or to loosen phlegm.    [provider]  Multiple Vitamins-Minerals (CENTRUM SILVER PO) Take 1 tablet by mouth daily.    [provider]  omeprazole (PRILOSEC) 20 MG capsule Take 20 mg by mouth daily as needed (indigestion).     [provider]  pseudoephedrine (SUDAFED) 30 MG tablet Take 30 mg by mouth daily as needed for congestion.    [provider]  psyllium (REGULOID) 0.52 G capsule Take 0.52 g by mouth 2 (two) times daily. I capsule 2x daily    [provider]  SIMETHICONE PO Take 1 tablet by mouth daily with supper. As needed    [provider]  sodium chloride (OCEAN) 0.65 % SOLN nasal spray Place 1 spray into both nostrils as needed for congestion.    [provider]      Allergies    Patient has no known allergies.    Review of Systems   Review of Systems  All other systems reviewed and are negative.   Physical Exam Updated Vital Signs BP (!) 167/90   Pulse 76   Temp 98.4 F (36.9 C)   Resp 16   SpO2 100%  Physical Exam Vitals and nursing note reviewed.  Constitutional:      General: He is not in acute distress.    Appearance: Normal appearance. He is not ill-appearing.  HENT:     Head: Normocephalic and atraumatic.  Eyes:     Comments: Bilateral eyes are grossly normal in appearance.  Pupils are equal, round, and reactive.  Funduscopic exam reveals no obvious abnormalities.  Neurological:     General: No focal  deficit present.     Mental Status: He is alert and oriented to person, place, and time. Mental status is at baseline.     Cranial Nerves: No cranial nerve deficit.     ED Results / Procedures / Treatments   Labs (all labs ordered are listed, but only abnormal results are displayed) Labs Reviewed - No data to display  EKG None  Radiology No results found.  Procedures Procedures  {Document cardiac monitor, telemetry assessment procedure when appropriate:1}  Medications Ordered in ED Medications - No data to display  ED Course/ Medical Decision Making/ A&P                           Medical Decision Making  ***  {Document  critical care time when appropriate:1} {Document review of labs and clinical decision tools ie heart score, Chads2Vasc2 etc:1}  {Document your independent review of radiology images, and any outside records:1} {Document your discussion with family members, caretakers, and with consultants:1} {Document social determinants of health affecting pt's care:1} {Document your decision making why or why not admission, treatments were needed:1} Final Clinical Impression(s) / ED Diagnoses Final diagnoses:  None    Rx / DC Orders ED Discharge Orders     None

## 2021-12-09 NOTE — Discharge Instructions (Signed)
Follow-up with ophthalmology.  We will continue to attempt to make arrangements for them to see you tomorrow.  Someone should get in touch with you to make this happen.  Return to the ER if symptoms significantly worsen or change.

## 2022-02-09 DIAGNOSIS — R221 Localized swelling, mass and lump, neck: Secondary | ICD-10-CM | POA: Diagnosis not present

## 2022-02-09 DIAGNOSIS — R030 Elevated blood-pressure reading, without diagnosis of hypertension: Secondary | ICD-10-CM | POA: Diagnosis not present

## 2022-02-14 ENCOUNTER — Encounter (HOSPITAL_BASED_OUTPATIENT_CLINIC_OR_DEPARTMENT_OTHER): Payer: Self-pay

## 2022-02-14 DIAGNOSIS — R002 Palpitations: Secondary | ICD-10-CM

## 2022-02-16 DIAGNOSIS — R221 Localized swelling, mass and lump, neck: Secondary | ICD-10-CM | POA: Diagnosis not present

## 2022-02-16 DIAGNOSIS — E041 Nontoxic single thyroid nodule: Secondary | ICD-10-CM | POA: Diagnosis not present

## 2022-02-24 ENCOUNTER — Ambulatory Visit: Payer: PPO | Attending: Family

## 2022-02-24 DIAGNOSIS — R002 Palpitations: Secondary | ICD-10-CM | POA: Diagnosis not present

## 2022-03-05 ENCOUNTER — Encounter: Payer: Self-pay | Admitting: Adult Health

## 2022-03-14 ENCOUNTER — Ambulatory Visit: Payer: PPO | Admitting: Physician Assistant

## 2022-03-21 ENCOUNTER — Encounter (HOSPITAL_BASED_OUTPATIENT_CLINIC_OR_DEPARTMENT_OTHER): Payer: Self-pay

## 2022-03-23 DIAGNOSIS — R221 Localized swelling, mass and lump, neck: Secondary | ICD-10-CM | POA: Diagnosis not present

## 2022-03-26 ENCOUNTER — Other Ambulatory Visit: Payer: Self-pay | Admitting: Otolaryngology

## 2022-03-26 DIAGNOSIS — R221 Localized swelling, mass and lump, neck: Secondary | ICD-10-CM

## 2022-04-02 DIAGNOSIS — H25012 Cortical age-related cataract, left eye: Secondary | ICD-10-CM | POA: Diagnosis not present

## 2022-04-02 DIAGNOSIS — H2513 Age-related nuclear cataract, bilateral: Secondary | ICD-10-CM | POA: Diagnosis not present

## 2022-04-02 DIAGNOSIS — H52203 Unspecified astigmatism, bilateral: Secondary | ICD-10-CM | POA: Diagnosis not present

## 2022-04-25 ENCOUNTER — Ambulatory Visit: Payer: PPO | Admitting: Adult Health

## 2022-04-25 VITALS — BP 136/80 | HR 73 | Ht 67.25 in | Wt 136.8 lb

## 2022-04-25 DIAGNOSIS — G40909 Epilepsy, unspecified, not intractable, without status epilepticus: Secondary | ICD-10-CM

## 2022-04-25 NOTE — Progress Notes (Signed)
PATIENT: Ethan Santana DOB: 04-Dec-1950  REASON FOR VISIT: follow up HISTORY FROM: patient  Chief Complaint  Patient presents with   Follow-up    Rm 19, No seizures.  Has been off keppra since last seen.      HISTORY OF PRESENT ILLNESS: Today 04/25/22:  Ethan Santana is a 71 year old male with a history of seizures.  He returns today for follow-up.  He reports that he Has been off Keppra since last visit. No seizure events. Has not had any type of aura since the last year.  States on occasion he will have a feeling that he describes as "off."  States that it lasted less than 1 to 2 seconds.  He returns today for an evaluation.  05/08/20: Ethan Santana is a 71 year old male with a history of seizures.  He returns today for follow-up.  The patient states that he reduce his dose of Keppra 225 mg daily.  He states that he always has events where he feels "a little off."  States that he may hear a different sound of music but only last for like 2 seconds.  He has never lost consciousness with his seizure type events.  He has not had a recent EEG through our office.  He returns today for an evaluation.  Ethan Santana is a 71 year old male with a history of seizures.  He returns today for follow-up.  At the last visit we switched him from carbamazepine to Keppra.  He states that double vision initially improved but never resolved.  He states that he now has an appointment with neuro-ophthalmology for evaluation.  He denies any seizure events.  Reports that he is tolerating Keppra well.  With his double vision he does not notice any significant weakness in the extremities and denies ptosis.  He returns today for an evaluation.  HISTORY:  08/04/19:   Ethan Santana is a 71 year old male with a history of seizures.  He returns today for follow-up.  He is currently on carbamazepine.  Denies any seizure events.  Reports that 2 weeks ago he decrease his dose of carbamazepine to half a tablet twice a day.  This  once due to ongoing double vision.  He reports that his double vision did improve with a decrease in dose but has not resolved.  He did see an ophthalmologist in the distant past that felt that it was due to carbamazepine.  He is interested in trying another medication.  And he is also been on Dilantin in the past.  He states in the past his seizures consisted of him "feeling odd."  He states that he never had a loss of consciousness or convulsing or staring off episodes.  REVIEW OF SYSTEMS: Out of a complete 14 system review of symptoms, the patient complains only of the following symptoms, and all other reviewed systems are negative.  See HPI  ALLERGIES: No Known Allergies  HOME MEDICATIONS: Outpatient Medications Prior to Visit  Medication Sig Dispense Refill   aspirin-acetaminophen-caffeine (EXCEDRIN MIGRAINE) 250-250-65 MG tablet Take 1 tablet by mouth daily as needed for headache or migraine.     calcium carbonate (TUMS EX) 750 MG chewable tablet Chew 1-2 tablets by mouth as needed. 1 tab in the morning, 2 tabs at dinner     clobetasol (TEMOVATE) 0.05 % external solution Apply 1 application topically 2 (two) times daily. (Patient taking differently: Apply 1 application  topically as needed.) 50 mL 6   famotidine (PEPCID) 20 MG tablet Take 20  mg by mouth daily.     fluticasone (FLONASE) 50 MCG/ACT nasal spray Place 1 spray into both nostrils daily.      guaiFENesin 200 MG tablet Take 400 mg by mouth 2 (two) times daily as needed for cough or to loosen phlegm.     Multiple Vitamins-Minerals (CENTRUM SILVER PO) Take 1 tablet by mouth daily.     omeprazole (PRILOSEC) 20 MG capsule Take 20 mg by mouth daily as needed (indigestion).      pseudoephedrine (SUDAFED) 30 MG tablet Take 30 mg by mouth daily as needed for congestion.     SIMETHICONE PO Take 1 tablet by mouth daily with supper. As needed     sodium chloride (OCEAN) 0.65 % SOLN nasal spray Place 1 spray into both nostrils as needed for  congestion.     psyllium (REGULOID) 0.52 G capsule Take 0.52 g by mouth 2 (two) times daily. I capsule 2x daily     No facility-administered medications prior to visit.    PAST MEDICAL HISTORY: Past Medical History:  Diagnosis Date   ADD (attention deficit disorder)    Arthritis    "little joint pain in my hands" (05/06/2018)   Chronic urticaria    Diplopia 01/11/2015   GERD (gastroesophageal reflux disease)    HBP (high blood pressure)    HNP (herniated nucleus pulposus), lumbar    Hx of seasonal allergies    Migraine    "maybe monthly since sinus OR" (05/06/2018)   Pneumonia 1990s   "once" (05/06/2018)   Refill clinic medication management patient 01/11/2015   Temporal lobe epilepsy [facility])    "nobody would ever know it but me" (05/06/2018)   Wears glasses     PAST SURGICAL HISTORY: Past Surgical History:  Procedure Laterality Date   BACK SURGERY     COLONOSCOPY     LUMBAR LAMINECTOMY/DECOMPRESSION MICRODISCECTOMY N/A 05/06/2018   Procedure: MICRODISCECTOMY LUMBAR ONE- LUMBAR TWO;  Surgeon: Lisbeth Renshaw, MD;  Location: [facility] OR;  Service: Neurosurgery;  Laterality: N/A;   LUMBAR MICRODISCECTOMY  05/06/2018   L1-L2   NASAL SINUS SURGERY  2000s   TONSILLECTOMY     WISDOM TOOTH EXTRACTION      FAMILY HISTORY: Family History  Problem Relation Age of Onset   Stroke Mother    Cancer Mother    Bone cancer Father    Seizures Neg Hx     SOCIAL HISTORY: Social History   Socioeconomic History   Marital status: Legally Separated    Spouse name: Jenel Lucks    Number of children: 2   Years of education: 12+   Highest education level: Not on file  Occupational History   Occupation: Self empolyed   Tobacco Use   Smoking status: Never   Smokeless tobacco: Never  Vaping Use   Vaping Use: Never used  Substance and Sexual Activity   Alcohol use: Not Currently   Drug use: Never   Sexual activity: Yes  Other Topics Concern   Not on file  Social History Narrative    Patient lives at home wife Jenel Lucks    Have have 2 children.    Patient is right handed.    Patient has his masters   Patient works at home.    Social Determinants of Health   Financial Resource Strain: Not on file  Food Insecurity: Not on file  Transportation Needs: Not on file  Physical Activity: Not on file  Stress: Not on file  Social Connections: Not on file  Intimate Partner  Violence: Not on file      PHYSICAL EXAM  Vitals:   04/25/22 0915  BP: 136/80  Pulse: 73  Weight: 136 lb 12.8 oz (62.1 kg)  Height: 5' 7.25" (1.708 m)   Body mass index is 21.27 kg/m.  Generalized: Well developed, in no acute distress   Neurological examination  Mentation: Alert oriented to time, place, history taking. Follows all commands speech and language fluent Cranial nerve II-XII: Pupils were equal round reactive to light. Extraocular movements were full, visual field were full on confrontational test. Facial sensation and strength were normal. Head turning and shoulder shrug  were normal and symmetric. Motor: The motor testing reveals 5 over 5 strength of all 4 extremities. Good symmetric motor tone is noted throughout.  Sensory: Sensory testing is intact to soft touch on all 4 extremities. No evidence of extinction is noted.  Coordination: Cerebellar testing reveals good finger-nose-finger and heel-to-shin bilaterally.  Gait and station: Gait is normal.  DIAGNOSTIC DATA (LABS, IMAGING, TESTING) - I reviewed patient records, labs, notes, testing and imaging myself where available.  Lab Results  Component Value Date   WBC 5.5 12/29/2018   HGB 17.2 12/29/2018   HCT 48.4 12/29/2018   MCV 87 12/29/2018   PLT 155 12/29/2018      Component Value Date/Time   NA 137 12/29/2018 1418   K 5.3 (H) 12/29/2018 1418   CL 96 12/29/2018 1418   CO2 25 12/29/2018 1418   GLUCOSE 96 12/29/2018 1418   GLUCOSE 105 (H) 05/06/2018 0810   BUN 23 12/29/2018 1418   CREATININE 1.06 12/29/2018 1418    CALCIUM 9.6 12/29/2018 1418   PROT 6.9 12/29/2018 1418   ALBUMIN 4.8 12/29/2018 1418   AST 13 12/29/2018 1418   ALT 11 12/29/2018 1418   ALKPHOS 111 12/29/2018 1418   BILITOT 0.4 12/29/2018 1418   GFRNONAA 73 12/29/2018 1418   GFRAA 84 12/29/2018 1418      ASSESSMENT AND PLAN 71 y.o. year old male  has a past medical history of ADD (attention deficit disorder), Arthritis, Chronic urticaria, Diplopia (01/11/2015), GERD (gastroesophageal reflux disease), HBP (high blood pressure), HNP (herniated nucleus pulposus), lumbar, seasonal allergies, Migraine, Pneumonia (1990s), Refill clinic medication management patient (01/11/2015), Temporal lobe epilepsy (HCC), and Wears glasses. here with :  1.  Seizures  -No longer on any antiepileptic medication -Advised if he has any seizure-like events he should let us know -Follow-up as needed.   Butch Penny, MSN, NP-C 04/25/2022, 9:33 AM [facility] [address], [address] [place], [place] [address] [phone]

## 2022-04-27 ENCOUNTER — Ambulatory Visit
Admission: RE | Admit: 2022-04-27 | Discharge: 2022-04-27 | Disposition: A | Discharge status: Home / Self Care | Payer: PPO | Source: Ambulatory Visit | Attending: Otolaryngology | Admitting: Otolaryngology

## 2022-04-27 DIAGNOSIS — I700 Atherosclerosis of aorta: Secondary | ICD-10-CM | POA: Diagnosis not present

## 2022-04-27 DIAGNOSIS — R221 Localized swelling, mass and lump, neck: Secondary | ICD-10-CM | POA: Diagnosis not present

## 2022-04-27 DIAGNOSIS — M47812 Spondylosis without myelopathy or radiculopathy, cervical region: Secondary | ICD-10-CM | POA: Diagnosis not present

## 2022-04-27 MED ORDER — IOPAMIDOL (ISOVUE-370) INJECTION 76%
75.0000 mL | Freq: Once | INTRAVENOUS | Status: AC | PRN
  Administered 2022-04-27: 75 mL via INTRAVENOUS

## 2022-05-10 DIAGNOSIS — L570 Actinic keratosis: Secondary | ICD-10-CM | POA: Diagnosis not present

## 2022-05-10 DIAGNOSIS — L814 Other melanin hyperpigmentation: Secondary | ICD-10-CM | POA: Diagnosis not present

## 2022-05-10 DIAGNOSIS — L821 Other seborrheic keratosis: Secondary | ICD-10-CM | POA: Diagnosis not present

## 2022-05-10 DIAGNOSIS — L820 Inflamed seborrheic keratosis: Secondary | ICD-10-CM | POA: Diagnosis not present

## 2022-05-14 ENCOUNTER — Encounter (HOSPITAL_BASED_OUTPATIENT_CLINIC_OR_DEPARTMENT_OTHER): Payer: Self-pay

## 2022-05-14 DIAGNOSIS — J309 Allergic rhinitis, unspecified: Secondary | ICD-10-CM | POA: Diagnosis not present

## 2022-05-14 DIAGNOSIS — E559 Vitamin D deficiency, unspecified: Secondary | ICD-10-CM | POA: Diagnosis not present

## 2022-05-14 DIAGNOSIS — K219 Gastro-esophageal reflux disease without esophagitis: Secondary | ICD-10-CM | POA: Diagnosis not present

## 2022-05-14 DIAGNOSIS — R519 Headache, unspecified: Secondary | ICD-10-CM | POA: Diagnosis not present

## 2022-05-14 DIAGNOSIS — G43909 Migraine, unspecified, not intractable, without status migrainosus: Secondary | ICD-10-CM | POA: Diagnosis not present

## 2022-05-14 DIAGNOSIS — R059 Cough, unspecified: Secondary | ICD-10-CM | POA: Diagnosis not present

## 2022-05-24 NOTE — Telephone Encounter (Signed)
Please advise 

## 2022-05-24 NOTE — Telephone Encounter (Signed)
Printed and on your desk, resulted 11/23

## 2022-07-11 DIAGNOSIS — I10 Essential (primary) hypertension: Secondary | ICD-10-CM | POA: Diagnosis not present

## 2022-07-11 DIAGNOSIS — Z125 Encounter for screening for malignant neoplasm of prostate: Secondary | ICD-10-CM | POA: Diagnosis not present

## 2022-07-11 DIAGNOSIS — D696 Thrombocytopenia, unspecified: Secondary | ICD-10-CM | POA: Diagnosis not present

## 2022-07-11 DIAGNOSIS — E559 Vitamin D deficiency, unspecified: Secondary | ICD-10-CM | POA: Diagnosis not present

## 2022-07-25 DIAGNOSIS — G40909 Epilepsy, unspecified, not intractable, without status epilepticus: Secondary | ICD-10-CM | POA: Diagnosis not present

## 2022-07-25 DIAGNOSIS — R972 Elevated prostate specific antigen [PSA]: Secondary | ICD-10-CM | POA: Diagnosis not present

## 2022-07-25 DIAGNOSIS — E559 Vitamin D deficiency, unspecified: Secondary | ICD-10-CM | POA: Diagnosis not present

## 2022-07-25 DIAGNOSIS — D696 Thrombocytopenia, unspecified: Secondary | ICD-10-CM | POA: Diagnosis not present

## 2022-07-25 DIAGNOSIS — Z0000 Encounter for general adult medical examination without abnormal findings: Secondary | ICD-10-CM | POA: Diagnosis not present

## 2022-08-13 DIAGNOSIS — Z1211 Encounter for screening for malignant neoplasm of colon: Secondary | ICD-10-CM | POA: Diagnosis not present

## 2022-08-13 DIAGNOSIS — Z1212 Encounter for screening for malignant neoplasm of rectum: Secondary | ICD-10-CM | POA: Diagnosis not present

## 2022-09-04 ENCOUNTER — Encounter (HOSPITAL_BASED_OUTPATIENT_CLINIC_OR_DEPARTMENT_OTHER): Payer: Self-pay

## 2022-09-04 MED ORDER — METOPROLOL TARTRATE 25 MG PO TABS: ORAL_TABLET | ORAL | 11 refills | Status: DC

## 2022-09-04 NOTE — Telephone Encounter (Signed)
Please ensure EKG just showing PAC, no need for changes?

## 2022-10-12 ENCOUNTER — Encounter (HOSPITAL_BASED_OUTPATIENT_CLINIC_OR_DEPARTMENT_OTHER): Payer: Self-pay | Admitting: Cardiology

## 2022-10-12 ENCOUNTER — Ambulatory Visit (HOSPITAL_BASED_OUTPATIENT_CLINIC_OR_DEPARTMENT_OTHER): Payer: PPO | Admitting: Cardiology

## 2022-10-12 VITALS — BP 128/70 | HR 59 | Ht 67.0 in | Wt 137.2 lb

## 2022-10-12 DIAGNOSIS — R002 Palpitations: Secondary | ICD-10-CM | POA: Diagnosis not present

## 2022-10-12 DIAGNOSIS — I498 Other specified cardiac arrhythmias: Secondary | ICD-10-CM

## 2022-10-12 DIAGNOSIS — Z8679 Personal history of other diseases of the circulatory system: Secondary | ICD-10-CM

## 2022-10-12 DIAGNOSIS — I493 Ventricular premature depolarization: Secondary | ICD-10-CM

## 2022-10-12 MED ORDER — METOPROLOL SUCCINATE ER 25 MG PO TB24: 25.0000 mg | ORAL_TABLET | Freq: Every day | ORAL | 3 refills | Status: DC

## 2022-10-12 NOTE — Patient Instructions (Addendum)
Medication Instructions:   Take the metoprolol succinate 25 mg daily (long acting version). If you have severe symptoms you can take an additional PRN metoprolol tartrate (short acting).  *If you need a refill on your cardiac medications before your next appointment, please call your pharmacy*   Follow-Up: At [facility], you and your health needs are our priority.  As part of our continuing mission to provide you with exceptional heart care, we have created designated Provider Care Teams.  These Care Teams include your primary Cardiologist (physician) and Advanced Practice Providers (APPs -  Physician Assistants and Nurse Practitioners) who all work together to provide you with the care you need, when you need it.  We recommend signing up for the patient portal called "MyChart".  Sign up information is provided on this After Visit Summary.  MyChart is used to connect with patients for Virtual Visits (Telemedicine).  Patients are able to view lab/test results, encounter notes, upcoming appointments, etc.  Non-urgent messages can be sent to your provider as well.   To learn more about what you can do with MyChart, go to [email or web address].    Your next appointment:   1 year(s)  Provider:   Jodelle Red, MD

## 2022-10-12 NOTE — Progress Notes (Signed)
Cardiology Office Note:    Date:  10/12/2022   ID:  Ethan [facility]ana, DOB [birth year], MRN [id number]  PCP:  Jackelyn Poling, DO  Cardiologist:  Jodelle Red, MD  Referring MD: Soundra Pilon, FNP   CC: Follow-up  History of Present Illness:    Ethan Santana is a 72 y.o. male with a hx of seizure disorder, hypertension, ADD, lumbar spinal stenosis who is seen for follow-up. I initially met him 07/09/2019 as a new consult at the request of Soundra Pilon, FNP for the evaluation and management of palpitations.  Today: Feeling heart "thumping." Reviewed recent mychart messages and Kardia strips. Feels that this has gotten progressively worse over years, not a sudden change. When he has symptoms, feels like every third beat is abnormal.   Reviewed Kardiamobile strips: 09/04/22 10:24: sinus, then sinus with ventricular bigeminy 09/04/22 10:27: sinus with ventricular bigeminy  Started metoprolol, initially felt much better when he took it, then stopped after he took the metoprolol a few times. Takes less than once/day.  Denies chest pain, shortness of breath at rest or with normal exertion. No PND, orthopnea, LE edema or unexpected weight gain. No syncope.   Past Medical History:  Diagnosis Date   ADD (attention deficit disorder)    Arthritis    "little joint pain in my hands" (05/06/2018)   Chronic urticaria    Diplopia 01/11/2015   GERD (gastroesophageal reflux disease)    HBP (high blood pressure)    HNP (herniated nucleus pulposus), lumbar    Hx of seasonal allergies    Migraine    "maybe monthly since sinus OR" (05/06/2018)   Pneumonia 1990s   "once" (05/06/2018)   Refill clinic medication management patient 01/11/2015   Temporal lobe epilepsy [facility])    "nobody would ever know it but me" (05/06/2018)   Wears glasses     Past Surgical History:  Procedure Laterality Date   BACK SURGERY     COLONOSCOPY     LUMBAR LAMINECTOMY/DECOMPRESSION MICRODISCECTOMY N/A  05/06/2018   Procedure: MICRODISCECTOMY LUMBAR ONE- LUMBAR TWO;  Surgeon: Lisbeth Renshaw, MD;  Location: [facility] OR;  Service: Neurosurgery;  Laterality: N/A;   LUMBAR MICRODISCECTOMY  05/06/2018   L1-L2   NASAL SINUS SURGERY  2000s   TONSILLECTOMY     WISDOM TOOTH EXTRACTION      Current Medications: Current Outpatient Medications on File Prior to Visit  Medication Sig   aspirin-acetaminophen-caffeine (EXCEDRIN MIGRAINE) 250-250-65 MG tablet Take 1 tablet by mouth daily as needed for headache or migraine.   calcium carbonate (TUMS EX) 750 MG chewable tablet Chew 1-2 tablets by mouth as needed. 1 tab in the morning, 2 tabs at dinner   clobetasol (TEMOVATE) 0.05 % external solution Apply 1 application topically 2 (two) times daily. (Patient taking differently: Apply 1 application  topically as needed.)   diclofenac Sodium (VOLTAREN) 1 % GEL Apply topically 4 (four) times daily.   famotidine (PEPCID) 20 MG tablet Take 20 mg by mouth daily.   fluticasone (FLONASE) 50 MCG/ACT nasal spray Place 1 spray into both nostrils daily.    guaiFENesin 200 MG tablet Take 400 mg by mouth 2 (two) times daily as needed for cough or to loosen phlegm.   metoprolol tartrate (LOPRESSOR) 25 MG tablet Take Metoprolol Tartrate 25mg  up to twice daily for heart rate greater than 110.   Multiple Vitamins-Minerals (CENTRUM SILVER PO) Take 1 tablet by mouth daily.   omeprazole (PRILOSEC) 20 MG capsule Take 20 mg by mouth  daily as needed (indigestion).    pseudoephedrine (SUDAFED) 30 MG tablet Take 30 mg by mouth daily as needed for congestion.   SIMETHICONE PO Take 1 tablet by mouth daily with supper. As needed   sodium chloride (OCEAN) 0.65 % SOLN nasal spray Place 1 spray into both nostrils as needed for congestion.   No current facility-administered medications on file prior to visit.     Allergies:   Patient has no known allergies.   Social History   Tobacco Use   Smoking status: Never   Smokeless tobacco:  Never  Vaping Use   Vaping Use: Never used  Substance Use Topics   Alcohol use: Not Currently   Drug use: Never    Family History: family history includes Bone cancer in his father; Cancer in his mother; Stroke in his mother. There is no history of Seizures. FH: mother had afib. Father had 5V CABG in his 54s-80s.   ROS:   Please see the history of present illness. (+) Palpitations All other systems are reviewed and negative.    EKGs/Labs/Other Studies Reviewed:    The following studies were reviewed today:  Monitor 10/2019-11/2019 14 days of data recorded on Zio monitor. Patient had a min HR of 47 bpm, max HR of 139 bpm, and avg HR of 65 bpm. Predominant underlying rhythm was Sinus Rhythm. No VT, SVT, atrial fibrillation, high degree block, or pauses noted. Monitor reports one episode of SVT, but this is actually a PAC triplet. Isolated atrial and ventricular ectopy was rare (<1%). There were 7 triggered events. These were sinus rhythm, with or without PAC. No significant arrhythmias detected.  EKG:  EKG is personally reviewed.   10/12/22: Sinus bradycardia at 59 bpm 03/20/2021: NSR at 70 bpm 07/09/2019: NSR  Recent Labs: No results found for requested labs within last 365 days.   Recent Lipid Panel No results found for: "CHOL", "TRIG", "HDL", "CHOLHDL", "VLDL", "LDLCALC", "LDLDIRECT"  Physical Exam:    VS:  BP 128/70   Pulse (!) 59   Ht 5\' 7"  (1.702 m)   Wt 137 lb 3.2 oz (62.2 kg)   SpO2 98%   BMI 21.49 kg/m     Wt Readings from Last 3 Encounters:  10/12/22 137 lb 3.2 oz (62.2 kg)  04/25/22 136 lb 12.8 oz (62.1 kg)  03/20/21 137 lb 3.2 oz (62.2 kg)    GEN: Well nourished, well developed in no acute distress HEENT: Normal, moist mucous membranes NECK: No JVD CARDIAC: regular rhythm, normal S1 and S2, no rubs or gallops. No murmurs. VASCULAR: Radial and DP pulses 2+ bilaterally. No carotid bruits RESPIRATORY:  Clear to auscultation without rales, wheezing or rhonchi   ABDOMEN: Soft, non-tender, non-distended MUSCULOSKELETAL:  Ambulates independently SKIN: Warm and dry, no edema NEUROLOGIC:  Alert and oriented x 3. No focal neuro deficits noted. PSYCHIATRIC:  Normal affect    ASSESSMENT:    1. Palpitation   2. PVC's (premature ventricular contractions)   3. Ventricular bigeminy   4. History of hypertension      PLAN:    Palpitations:  -intermittent -reviewed Kardia mobile strips today -discussed red flag warning signs that need immediate medical attention -discussed potential triggers. He will watch for patterns -ECG sinus bradycardia -Kardia strips with brief ventricular bigeminy -if symptoms recur/worsen, he will contact me and we will send a monitor  Hypertension, history of: now controlled on no meds -at goal on no HTN specific meds -on metoprolol for palpitations/PVCs -he will continue to monitor  Cardiac risk counseling and prevention recommendations: for primary prevention -recommend heart healthy/Mediterranean diet, with whole grains, fruits, vegetable, fish, lean meats, nuts, and olive oil. Limit salt. -recommend moderate walking, 3-5 times/week for 30-50 minutes each session. Aim for at least 150 minutes.week. Goal should be pace of 3 miles/hours, or walking 1.5 miles in 30 minutes  Plan for follow up: 12 mos  Jodelle Red, MD, PhD [place]  [facility]    Medication Adjustments/Labs and Tests Ordered: Current medicines are reviewed at length with the patient today.  Concerns regarding medicines are outlined above.   Orders Placed This Encounter  Procedures   EKG 12-Lead    Meds ordered this encounter  Medications   metoprolol succinate (TOPROL XL) 25 MG 24 hr tablet    Sig: Take 1 tablet (25 mg total) by mouth daily.    Dispense:  90 tablet    Refill:  3    Patient Instructions  Medication Instructions:   Take the metoprolol succinate 25 mg daily (long acting version). If you have severe  symptoms you can take an additional PRN metoprolol tartrate (short acting).  *If you need a refill on your cardiac medications before your next appointment, please call your pharmacy*   Follow-Up: At [facility], you and your health needs are our priority.  As part of our continuing mission to provide you with exceptional heart care, we have created designated Provider Care Teams.  These Care Teams include your primary Cardiologist (physician) and Advanced Practice Providers (APPs -  Physician Assistants and Nurse Practitioners) who all work together to provide you with the care you need, when you need it.  We recommend signing up for the patient portal called "MyChart".  Sign up information is provided on this After Visit Summary.  MyChart is used to connect with patients for Virtual Visits (Telemedicine).  Patients are able to view lab/test results, encounter notes, upcoming appointments, etc.  Non-urgent messages can be sent to your provider as well.   To learn more about what you can do with MyChart, go to [email or web address].    Your next appointment:   1 year(s)  Provider:   Jodelle Red, MD          Signed, Jodelle Red, MD PhD 10/12/2022 1:20 PM    [facility]

## 2022-11-02 ENCOUNTER — Other Ambulatory Visit: Payer: Self-pay | Admitting: Urology

## 2022-11-02 DIAGNOSIS — R972 Elevated prostate specific antigen [PSA]: Secondary | ICD-10-CM

## 2022-11-02 DIAGNOSIS — N402 Nodular prostate without lower urinary tract symptoms: Secondary | ICD-10-CM | POA: Diagnosis not present

## 2022-11-02 DIAGNOSIS — R3121 Asymptomatic microscopic hematuria: Secondary | ICD-10-CM | POA: Diagnosis not present

## 2022-11-09 DIAGNOSIS — S0123XA Puncture wound without foreign body of nose, initial encounter: Secondary | ICD-10-CM | POA: Diagnosis not present

## 2022-11-09 DIAGNOSIS — L089 Local infection of the skin and subcutaneous tissue, unspecified: Secondary | ICD-10-CM | POA: Diagnosis not present

## 2022-11-13 ENCOUNTER — Encounter (HOSPITAL_BASED_OUTPATIENT_CLINIC_OR_DEPARTMENT_OTHER): Payer: Self-pay

## 2022-12-21 ENCOUNTER — Ambulatory Visit
Admission: RE | Admit: 2022-12-21 | Discharge: 2022-12-21 | Disposition: A | Discharge status: Home / Self Care | Payer: PPO | Source: Ambulatory Visit | Attending: Urology | Admitting: Urology

## 2022-12-21 DIAGNOSIS — R972 Elevated prostate specific antigen [PSA]: Secondary | ICD-10-CM | POA: Diagnosis not present

## 2022-12-21 MED ORDER — GADOPICLENOL 0.5 MMOL/ML IV SOLN
6.0000 mL | Freq: Once | INTRAVENOUS | Status: AC | PRN
  Administered 2022-12-21: 6 mL via INTRAVENOUS

## 2023-01-03 DIAGNOSIS — L821 Other seborrheic keratosis: Secondary | ICD-10-CM | POA: Diagnosis not present

## 2023-01-03 DIAGNOSIS — L814 Other melanin hyperpigmentation: Secondary | ICD-10-CM | POA: Diagnosis not present

## 2023-01-03 DIAGNOSIS — L570 Actinic keratosis: Secondary | ICD-10-CM | POA: Diagnosis not present

## 2023-01-09 DIAGNOSIS — C61 Malignant neoplasm of prostate: Secondary | ICD-10-CM | POA: Diagnosis not present

## 2023-01-09 DIAGNOSIS — R972 Elevated prostate specific antigen [PSA]: Secondary | ICD-10-CM | POA: Diagnosis not present

## 2023-01-16 DIAGNOSIS — N402 Nodular prostate without lower urinary tract symptoms: Secondary | ICD-10-CM | POA: Diagnosis not present

## 2023-01-16 DIAGNOSIS — C61 Malignant neoplasm of prostate: Secondary | ICD-10-CM | POA: Diagnosis not present

## 2023-02-04 ENCOUNTER — Encounter: Payer: Self-pay | Admitting: Radiation Oncology

## 2023-02-04 NOTE — Progress Notes (Signed)
GU Location of Tumor / Histology: Prostate Ca  If Prostate Cancer, Gleason Score is (3 + 4) and PSA is (4.77 on 07/27/2022)  PSA 4.83 on 07/12/2022  Biopsies      12/21/2022 Dr. [name] MR Prostate with/without Contrast CLINICAL DATA: Elevated PSA level of 4.83 on 07/11/2022.   IMPRESSION: 1. PI-RADS category 4 lesion of the right posterolateral peripheral zone in the mid gland. Targeting data sent to [facility]. 2. Mild prostatomegaly.  Past/Anticipated interventions by urology, if any:   01/17/2023 Dr. [name]   Past/Anticipated interventions by medical oncology, if any: NA  Weight changes, if any:  No  IPSS:  4 SHIM:  22  Bowel/Bladder complaints, if any:  No  Nausea/Vomiting, if any:  No  Pain issues, if any:  0/10  SAFETY ISSUES: Prior radiation?  No Pacemaker/ICD? No Possible current pregnancy? Male Is the patient on methotrexate? No  Current Complaints / other details:

## 2023-02-08 ENCOUNTER — Encounter: Payer: Self-pay | Admitting: Radiation Oncology

## 2023-02-08 ENCOUNTER — Ambulatory Visit
Admission: RE | Admit: 2023-02-08 | Discharge: 2023-02-08 | Disposition: A | Discharge status: Home / Self Care | Payer: PPO | Source: Ambulatory Visit | Attending: Radiation Oncology | Admitting: Radiation Oncology

## 2023-02-08 VITALS — BP 124/67 | HR 62 | Temp 97.1°F | Resp 18 | Ht 67.0 in | Wt 140.1 lb

## 2023-02-08 DIAGNOSIS — K219 Gastro-esophageal reflux disease without esophagitis: Secondary | ICD-10-CM | POA: Diagnosis not present

## 2023-02-08 DIAGNOSIS — Z79899 Other long term (current) drug therapy: Secondary | ICD-10-CM | POA: Insufficient documentation

## 2023-02-08 DIAGNOSIS — Z191 Hormone sensitive malignancy status: Secondary | ICD-10-CM | POA: Diagnosis not present

## 2023-02-08 DIAGNOSIS — G40109 Localization-related (focal) (partial) symptomatic epilepsy and epileptic syndromes with simple partial seizures, not intractable, without status epilepticus: Secondary | ICD-10-CM | POA: Diagnosis not present

## 2023-02-08 DIAGNOSIS — C61 Malignant neoplasm of prostate: Secondary | ICD-10-CM | POA: Insufficient documentation

## 2023-02-08 DIAGNOSIS — Z791 Long term (current) use of non-steroidal anti-inflammatories (NSAID): Secondary | ICD-10-CM | POA: Insufficient documentation

## 2023-02-08 HISTORY — DX: Elevated prostate specific antigen (PSA): R97.20

## 2023-02-08 NOTE — Progress Notes (Signed)
Radiation Oncology         [phone] ________________________________  Initial Outpatient Consultation  Name: Ethan Santana MRN: [id number]  Date: 02/08/2023  DOB: 1951-05-06  EX:[id number], [name], DO  Pace, Weyman Croon, MD   REFERRING PHYSICIAN: Noel Christmas, MD  DIAGNOSIS: 72 y.o. gentleman with Stage T2a adenocarcinoma of the prostate with Gleason score of 3+4, and PSA of 4.77.    ICD-10-CM   1. Malignant neoplasm of prostate (HCC)  C61       HISTORY OF PRESENT ILLNESS: Ethan Santana is a 72 y.o. male with a diagnosis of prostate cancer. He was noted to have an elevated PSA of 4.83 on routine labs with his primary care physician, Dr. Atha Starks.  A repeat PSA on 07/25/2022 remained elevated at 4.77.  Accordingly, he was referred for evaluation in urology by Dr. Arita Miss on 11/02/2022,  digital rectal examination performed at that time showed a palpable nodule at the right base of the prostate gland.  An MRI of the prostate was performed on 12/21/2022 and showed a PI-RADS 4 lesion in the right posterior lateral peripheral zone, mid gland.  Therefore, the patient proceeded to MRI fusion transrectal ultrasound with 16 biopsies of the prostate on 01/09/2023.  The prostate volume measured 36.4 cc.  Out of 16 core biopsies, 4 were positive.  The maximum Gleason score was 3+4, and this was seen in 3 of the 4 samples from the MRI ROI.  Additionally, Gleason 3+3 was seen in the right base lateral.  The patient reviewed the biopsy results with his urologist and he has kindly been referred today for discussion of potential radiation treatment options.   PREVIOUS RADIATION THERAPY: No  PAST MEDICAL HISTORY:  Past Medical History:  Diagnosis Date   ADD (attention deficit disorder)    Arthritis    "little joint pain in my hands" (05/06/2018)   Chronic urticaria    Diplopia 01/11/2015   Elevated PSA    GERD (gastroesophageal reflux disease)    HBP (high blood pressure)    HNP (herniated  nucleus pulposus), lumbar    Hx of seasonal allergies    Migraine    "maybe monthly since sinus OR" (05/06/2018)   Pneumonia 1990s   "once" (05/06/2018)   Refill clinic medication management patient 01/11/2015   Temporal lobe epilepsy [facility])    "nobody would ever know it but me" (05/06/2018)   Wears glasses       PAST SURGICAL HISTORY: Past Surgical History:  Procedure Laterality Date   BACK SURGERY     COLONOSCOPY     LUMBAR LAMINECTOMY/DECOMPRESSION MICRODISCECTOMY N/A 05/06/2018   Procedure: MICRODISCECTOMY LUMBAR ONE- LUMBAR TWO;  Surgeon: Lisbeth Renshaw, MD;  Location: [facility] OR;  Service: Neurosurgery;  Laterality: N/A;   LUMBAR MICRODISCECTOMY  05/06/2018   L1-L2   NASAL SINUS SURGERY  2000s   PROSTATE BIOPSY     TONSILLECTOMY     WISDOM TOOTH EXTRACTION      FAMILY HISTORY:  Family History  Problem Relation Age of Onset   Stroke Mother    Cancer Mother    Bone cancer Father    Seizures Neg Hx     SOCIAL HISTORY:  Social History   Socioeconomic History   Marital status: Legally Separated    Spouse name: Jenel Lucks    Number of children: 2   Years of education: 12+   Highest education level: Not on file  Occupational History   Occupation: Self empolyed   Tobacco Use  Smoking status: Never   Smokeless tobacco: Never  Vaping Use   Vaping status: Never Used  Substance and Sexual Activity   Alcohol use: Not Currently   Drug use: Never   Sexual activity: Yes  Other Topics Concern   Not on file  Social History Narrative   Patient lives at home wife Jenel Lucks    Have have 2 children.    Patient is right handed.    Patient has his masters   Patient works at home.    Social Determinants of Health   Financial Resource Strain: Not on file  Food Insecurity: No Food Insecurity (02/08/2023)   Hunger Vital Sign    Worried About Running Out of Food in the Last Year: Never true    Ran Out of Food in the Last Year: Never true  Transportation Needs: No  Transportation Needs (02/08/2023)   PRAPARE - Administrator, Civil Service (Medical): No    Lack of Transportation (Non-Medical): No  Physical Activity: Not on file  Stress: Not on file  Social Connections: Not on file  Intimate Partner Violence: Not At Risk (02/08/2023)   Humiliation, Afraid, Rape, and Kick questionnaire    Fear of Current or Ex-Partner: No    Emotionally Abused: No    Physically Abused: No    Sexually Abused: No    ALLERGIES: Patient has no known allergies.  MEDICATIONS:  Current Outpatient Medications  Medication Sig Dispense Refill   aspirin-acetaminophen-caffeine (EXCEDRIN MIGRAINE) 250-250-65 MG tablet Take 1 tablet by mouth daily as needed for headache or migraine.     calcium carbonate (TUMS EX) 750 MG chewable tablet Chew 1-2 tablets by mouth as needed. 1 tab in the morning, 2 tabs at dinner     clobetasol (TEMOVATE) 0.05 % external solution Apply 1 application topically 2 (two) times daily. (Patient taking differently: Apply 1 application  topically as needed.) 50 mL 6   diclofenac Sodium (VOLTAREN) 1 % GEL Apply topically 4 (four) times daily.     famotidine (PEPCID) 20 MG tablet Take 20 mg by mouth daily.     fluticasone (FLONASE) 50 MCG/ACT nasal spray Place 1 spray into both nostrils daily.      guaiFENesin 200 MG tablet Take 400 mg by mouth 2 (two) times daily as needed for cough or to loosen phlegm.     metoprolol succinate (TOPROL XL) 25 MG 24 hr tablet Take 1 tablet (25 mg total) by mouth daily. 90 tablet 3   Multiple Vitamins-Minerals (CENTRUM SILVER PO) Take 1 tablet by mouth daily.     omeprazole (PRILOSEC) 20 MG capsule Take 20 mg by mouth daily as needed (indigestion).      pseudoephedrine (SUDAFED) 30 MG tablet Take 30 mg by mouth daily as needed for congestion.     SIMETHICONE PO Take 1 tablet by mouth daily with supper. As needed     sodium chloride (OCEAN) 0.65 % SOLN nasal spray Place 1 spray into both nostrils as needed for  congestion.     No current facility-administered medications for this encounter.    REVIEW OF SYSTEMS:  On review of systems, the patient reports that he is doing well overall. He denies any chest pain, shortness of breath, cough, fevers, chills, night sweats, unintended weight changes. He denies any bowel disturbances, and denies abdominal pain, nausea or vomiting. He denies any new musculoskeletal or joint aches or pains. His IPSS was 4, indicating mild urinary symptoms. His SHIM was 22, indicating he does  not have erectile dysfunction. A complete review of systems is obtained and is otherwise negative.    PHYSICAL EXAM:  Wt Readings from Last 3 Encounters:  02/08/23 140 lb 2 oz (63.6 kg)  10/12/22 137 lb 3.2 oz (62.2 kg)  04/25/22 136 lb 12.8 oz (62.1 kg)   Temp Readings from Last 3 Encounters:  02/08/23 (!) 97.1 F (36.2 C) (Temporal)  12/08/21 98.4 F (36.9 C)  08/04/19 (!) 97.5 F (36.4 C)   BP Readings from Last 3 Encounters:  02/08/23 124/67  10/12/22 128/70  04/25/22 136/80   Pulse Readings from Last 3 Encounters:  02/08/23 62  10/12/22 (!) 59  04/25/22 73   Pain Assessment Pain Score: 0-No pain/10  In general this is a well appearing Caucasian male in no acute distress. He's alert and oriented x4 and appropriate throughout the examination. Cardiopulmonary assessment is negative for acute distress, and he exhibits normal effort.     KPS = 100  100 - Normal; no complaints; no evidence of disease. 90   - Able to carry on normal activity; minor signs or symptoms of disease. 80   - Normal activity with effort; some signs or symptoms of disease. 33   - Cares for self; unable to carry on normal activity or to do active work. 60   - Requires occasional assistance, but is able to care for most of his personal needs. 50   - Requires considerable assistance and frequent medical care. 40   - Disabled; requires special care and assistance. 30   - Severely disabled;  hospital admission is indicated although death not imminent. 20   - Very sick; hospital admission necessary; active supportive treatment necessary. 10   - Moribund; fatal processes progressing rapidly. 0     - Dead  Karnofsky DA, Abelmann WH, Craver LS and [facility] [phone]) The use of the nitrogen mustards in the palliative treatment of carcinoma: with particular reference to bronchogenic carcinoma Cancer 1 [phone]  LABORATORY DATA:  Lab Results  Component Value Date   WBC 5.5 12/29/2018   HGB 17.2 12/29/2018   HCT 48.4 12/29/2018   MCV 87 12/29/2018   PLT 155 12/29/2018   Lab Results  Component Value Date   NA 137 12/29/2018   K 5.3 (H) 12/29/2018   CL 96 12/29/2018   CO2 25 12/29/2018   Lab Results  Component Value Date   ALT 11 12/29/2018   AST 13 12/29/2018   ALKPHOS 111 12/29/2018   BILITOT 0.4 12/29/2018     RADIOGRAPHY: No results found.    IMPRESSION/PLAN: 1. 72 y.o. gentleman with Stage T2a adenocarcinoma of the prostate with Gleason Score of 3+4, and PSA of 4.77. We discussed the patient's workup and outlined the nature of prostate cancer in this setting. The patient's T stage, Gleason's score, and PSA put him into the favorable intermediate risk group. Accordingly, he is eligible for a variety of potential treatment options including active surveillance, brachytherapy, 5.5 weeks of external radiation, or prostatectomy.  He is adamantly not interested in active surveillance or surgical prostatectomy.  We discussed the available radiation techniques, and focused on the details and logistics of delivery. We discussed and outlined the risks, benefits, short and long-term effects associated with radiotherapy and compared and contrasted these with prostatectomy. We discussed the role of SpaceOAR gel in reducing the rectal toxicity associated with radiotherapy. He appears to have a good understanding of his disease and our treatment recommendations which are of curative  intent.  He was encouraged to ask questions that were answered to his stated satisfaction.  He had questions about cryotherapy and high-frequency ultrasound (HiFU) therapy for management of prostate cancer but understands that we do not offer those here in our cancer center as they are not currently standard of care.  At the conclusion of our conversation, the patient appears to be leaning towards brachytherapy but is interested in learning more about cryotherapy and HiFU prior to making a final decision.  We will share our discussion with Dr. Arita Miss and make a referral to [facility] cancer center to explore the focal treatment options so that he is fully informed prior to making his decision.  He has our contact information and will let us know once he has reached a final decision so that we can proceed with treatment planning accordingly at that time.  He knows that he is welcome to call at anytime in the interim with any questions or concerns related to the treatment options offered here at our cancer center.  We enjoyed meeting him and look forward to continuing to participate in his care.  We personally spent 70 minutes in this encounter including chart review, reviewing radiological studies, meeting face-to-face with the patient, entering orders and completing documentation.    Marguarite Arbour, PA-C    Margaretmary Dys, MD  [facility]  Radiation Oncology Direct Dial: [phone]  Fax: [phone] [email or web address]  Skype  [email or web address]

## 2023-02-13 ENCOUNTER — Telehealth: Payer: Self-pay | Admitting: Radiation Oncology

## 2023-02-13 ENCOUNTER — Other Ambulatory Visit: Payer: Self-pay | Admitting: Urology

## 2023-02-13 ENCOUNTER — Telehealth: Payer: Self-pay | Admitting: *Deleted

## 2023-02-13 DIAGNOSIS — C61 Malignant neoplasm of prostate: Secondary | ICD-10-CM

## 2023-02-13 NOTE — Telephone Encounter (Signed)
CALLED PATIENT TO INFORM OF APPT. WITH ALEJANDRO RODIGUEZ ON 04-18-23- ARRIVAL TIME- 10:35 AM - ADDRESS- [address] [place]- PH.NO. - [phone], LVM FOR A RETURN CALL

## 2023-02-13 NOTE — Telephone Encounter (Signed)
10/9 @ 10:48 am Received fax from Call Center from [facility]) stating they received referral, but patient is out of network.  Sent secure chat to Darryl Nestle so they are aware.

## 2023-02-14 ENCOUNTER — Other Ambulatory Visit: Payer: Self-pay | Admitting: Medical Genetics

## 2023-02-14 DIAGNOSIS — Z006 Encounter for examination for normal comparison and control in clinical research program: Secondary | ICD-10-CM

## 2023-02-15 NOTE — Progress Notes (Signed)
Spoke with patient via telephone to introduce myself as the prostate nurse navigator and discussed my role.  No barriers to care identified at this time.  Patient was interested in a referral to learn more about HiFU and Cyro however, [facility] and [facility] are out of network.  RN offered to research additional places in [place].  Patient would like to do a little more self research and will contact me if he feels he needs an additional referral.  Patient is leaning more towards brachytherapy.  Patient will follow up next week.

## 2023-02-21 NOTE — Progress Notes (Signed)
Patient has decided to proceed with brachytherapy.  MD's notified, plan of care in progress.

## 2023-03-12 ENCOUNTER — Telehealth: Payer: Self-pay | Admitting: *Deleted

## 2023-03-12 NOTE — Telephone Encounter (Signed)
CALLED PATIENT TO ASK QUESTION, SPOKE WITH PATIENT 

## 2023-03-13 ENCOUNTER — Other Ambulatory Visit: Payer: Self-pay | Admitting: Urology

## 2023-03-15 NOTE — Progress Notes (Signed)
RN spoke with patient to review CT Simulation and brachytherapy.  All questions answered.  Written education provided.  Patient encouraged to call with any questions or concerns prior to brachytherapy.

## 2023-04-11 DIAGNOSIS — H2513 Age-related nuclear cataract, bilateral: Secondary | ICD-10-CM | POA: Diagnosis not present

## 2023-04-11 DIAGNOSIS — H524 Presbyopia: Secondary | ICD-10-CM | POA: Diagnosis not present

## 2023-04-24 ENCOUNTER — Telehealth: Payer: Self-pay | Admitting: *Deleted

## 2023-04-24 NOTE — Progress Notes (Signed)
  Radiation Oncology         [phone]) [phone] ________________________________  Name: Ethan Santana MRN: [id number]  Date: 04/25/2023  DOB: [birth year]  SIMULATION AND TREATMENT PLANNING NOTE PUBIC ARCH STUDY  [id number], [name], DO  [name] D, MD  DIAGNOSIS:  72 y.o. gentleman with Stage T2a adenocarcinoma of the prostate with Gleason score of 3+4, and PSA of 4.77.   Oncology History  Malignant neoplasm of prostate (HCC)  01/09/2023 Cancer Staging   Staging form: Prostate, AJCC 8th Edition - Clinical stage from 01/09/2023: Stage IIB (cT2a, cN0, cM0, PSA: 4.8, Grade Group: 2) - Signed by Marcello Fennel, PA-C on 02/08/2023 Histopathologic type: Adenocarcinoma, NOS Stage prefix: Initial diagnosis Prostate specific antigen (PSA) range: Less than 10 Gleason primary pattern: 3 Gleason secondary pattern: 4 Gleason score: 7 Histologic grading system: 5 grade system Number of biopsy cores examined: 16 Number of biopsy cores positive: 4 Location of positive needle core biopsies: One side   02/08/2023 Initial Diagnosis   Malignant neoplasm of prostate (HCC)       ICD-10-CM   1. Malignant neoplasm of prostate (HCC)  C61       COMPLEX SIMULATION:  The patient presented today for evaluation for possible prostate seed implant. He was brought to the radiation planning suite and placed supine on the CT couch. A 3-dimensional image study set was obtained in upload to the planning computer. There, on each axial slice, I contoured the prostate gland. Then, using three-dimensional radiation planning tools I reconstructed the prostate in view of the structures from the transperineal needle pathway to assess for possible pubic arch interference. In doing so, I did not appreciate any pubic arch interference. Also, the patient's prostate volume was estimated based on the drawn structure. The volume was 36 cc.  Given the pubic arch appearance and prostate volume, patient remains a good candidate to  proceed with prostate seed implant. Today, he freely provided informed written consent to proceed.    PLAN: The patient will undergo prostate seed implant.   ________________________________  Artist Pais. Kathrynn Running, M.D.

## 2023-04-24 NOTE — Telephone Encounter (Signed)
CALLED PATIENT TO REMIND OF PRE-SEED APPTS. FOR 04-25-23, SPOKE WITH PATIENT AND HE IS AWARE OF THESE APPTS.

## 2023-04-24 NOTE — Progress Notes (Signed)
Radiation Oncology         [phone]) [phone] ________________________________  Outpatient Follow up- Pre-seed visit  Name: Ethan Santana MRN: [id number]  Date: 04/25/2023  DOB: [birth year]  WJ:[id number], [name], DO  Pace, Weyman Croon, MD   REFERRING PHYSICIAN: Noel Christmas, MD  DIAGNOSIS: 72 y.o. gentleman with Stage T2a adenocarcinoma of the prostate with Gleason score of 3+4, and PSA of 4.77.     ICD-10-CM   1. Malignant neoplasm of prostate (HCC)  C61       HISTORY OF PRESENT ILLNESS: Ethan Santana is a 72 y.o. male with a diagnosis of prostate cancer. He was noted to have an elevated PSA of 4.83 on routine labs with his primary care physician, Dr. Atha Starks.  A repeat PSA on 07/25/2022 remained elevated at 4.77.  Accordingly, he was referred for evaluation in urology by Dr. Arita Miss on 11/02/2022,  digital rectal examination performed at that time showed a palpable nodule at the right base of the prostate gland.  An MRI of the prostate was performed on 12/21/2022 and showed a PI-RADS 4 lesion in the right posterior lateral peripheral zone, mid gland.  Therefore, the patient proceeded to MRI fusion transrectal ultrasound with 16 biopsies of the prostate on 01/09/2023.  The prostate volume measured 36.4 cc.  Out of 16 core biopsies, 4 were positive.  The maximum Gleason score was 3+4, and this was seen in 3 of the 4 samples from the MRI ROI.  Additionally, Gleason 3+3 was seen in the right base lateral.   The patient reviewed the biopsy results with his urologist and was kindly referred to [place] for discussion of potential radiation treatment options. We initially met the patient on 02/08/23 and he has elected to proceed with brachytherapy and SpaceOAR gel placement for treatment of his disease. He is here today for his pre-procedure imaging for planning and to answer any additional questions he may have about this treatment.   PREVIOUS RADIATION THERAPY: No  PAST MEDICAL HISTORY:  Past Medical  History:  Diagnosis Date   ADD (attention deficit disorder)    Arthritis    "little joint pain in my hands" (05/06/2018)   Chronic urticaria    Diplopia 01/11/2015   Elevated PSA    GERD (gastroesophageal reflux disease)    HBP (high blood pressure)    HNP (herniated nucleus pulposus), lumbar    Hx of seasonal allergies    Migraine    "maybe monthly since sinus OR" (05/06/2018)   Pneumonia 1990s   "once" (05/06/2018)   Refill clinic medication management patient 01/11/2015   Temporal lobe epilepsy [facility])    "nobody would ever know it but me" (05/06/2018)   Wears glasses       PAST SURGICAL HISTORY: Past Surgical History:  Procedure Laterality Date   BACK SURGERY     COLONOSCOPY     LUMBAR LAMINECTOMY/DECOMPRESSION MICRODISCECTOMY N/A 05/06/2018   Procedure: MICRODISCECTOMY LUMBAR ONE- LUMBAR TWO;  Surgeon: Lisbeth Renshaw, MD;  Location: [facility] OR;  Service: Neurosurgery;  Laterality: N/A;   LUMBAR MICRODISCECTOMY  05/06/2018   L1-L2   NASAL SINUS SURGERY  2000s   PROSTATE BIOPSY     TONSILLECTOMY     WISDOM TOOTH EXTRACTION      FAMILY HISTORY:  Family History  Problem Relation Age of Onset   Stroke Mother    Cancer Mother    Bone cancer Father    Seizures Neg Hx     SOCIAL HISTORY:  Social History  Socioeconomic History   Marital status: Legally Separated    Spouse name: Jenel Lucks    Number of children: 2   Years of education: 12+   Highest education level: Not on file  Occupational History   Occupation: Self empolyed   Tobacco Use   Smoking status: Never   Smokeless tobacco: Never  Vaping Use   Vaping status: Never Used  Substance and Sexual Activity   Alcohol use: Not Currently   Drug use: Never   Sexual activity: Yes  Other Topics Concern   Not on file  Social History Narrative   Patient lives at home wife Jenel Lucks    Have have 2 children.    Patient is right handed.    Patient has his masters   Patient works at home.    Social Drivers of  Corporate investment banker Strain: Not on file  Food Insecurity: No Food Insecurity (02/08/2023)   Hunger Vital Sign    Worried About Running Out of Food in the Last Year: Never true    Ran Out of Food in the Last Year: Never true  Transportation Needs: No Transportation Needs (02/08/2023)   PRAPARE - Administrator, Civil Service (Medical): No    Lack of Transportation (Non-Medical): No  Physical Activity: Not on file  Stress: Not on file  Social Connections: Not on file  Intimate Partner Violence: Not At Risk (02/08/2023)   Humiliation, Afraid, Rape, and Kick questionnaire    Fear of Current or Ex-Partner: No    Emotionally Abused: No    Physically Abused: No    Sexually Abused: No    ALLERGIES: Patient has no known allergies.  MEDICATIONS:  Current Outpatient Medications  Medication Sig Dispense Refill   aspirin-acetaminophen-caffeine (EXCEDRIN MIGRAINE) 250-250-65 MG tablet Take 1 tablet by mouth daily as needed for headache or migraine.     calcium carbonate (TUMS EX) 750 MG chewable tablet Chew 1-2 tablets by mouth as needed. 1 tab in the morning, 2 tabs at dinner     clobetasol (TEMOVATE) 0.05 % external solution Apply 1 application topically 2 (two) times daily. (Patient taking differently: Apply 1 application  topically as needed.) 50 mL 6   diclofenac Sodium (VOLTAREN) 1 % GEL Apply topically 4 (four) times daily.     famotidine (PEPCID) 20 MG tablet Take 20 mg by mouth daily.     fluticasone (FLONASE) 50 MCG/ACT nasal spray Place 1 spray into both nostrils daily.      guaiFENesin 200 MG tablet Take 400 mg by mouth 2 (two) times daily as needed for cough or to loosen phlegm.     metoprolol succinate (TOPROL XL) 25 MG 24 hr tablet Take 1 tablet (25 mg total) by mouth daily. 90 tablet 3   Multiple Vitamins-Minerals (CENTRUM SILVER PO) Take 1 tablet by mouth daily.     omeprazole (PRILOSEC) 20 MG capsule Take 20 mg by mouth daily as needed (indigestion).       pseudoephedrine (SUDAFED) 30 MG tablet Take 30 mg by mouth daily as needed for congestion.     SIMETHICONE PO Take 1 tablet by mouth daily with supper. As needed     sodium chloride (OCEAN) 0.65 % SOLN nasal spray Place 1 spray into both nostrils as needed for congestion.     No current facility-administered medications for this visit.    REVIEW OF SYSTEMS:  On review of systems, the patient reports that he is doing well overall. He denies any chest  pain, shortness of breath, cough, fevers, chills, night sweats, unintended weight changes. He denies any bowel disturbances, and denies abdominal pain, nausea or vomiting. He denies any new musculoskeletal or joint aches or pains. His IPSS was 4, indicating mild urinary symptoms. His SHIM was 22, indicating he does not have erectile dysfunction. A complete review of systems is obtained and is otherwise negative.    PHYSICAL EXAM:  Wt Readings from Last 3 Encounters:  02/08/23 140 lb 2 oz (63.6 kg)  10/12/22 137 lb 3.2 oz (62.2 kg)  04/25/22 136 lb 12.8 oz (62.1 kg)   Temp Readings from Last 3 Encounters:  02/08/23 (!) 97.1 F (36.2 C) (Temporal)  12/08/21 98.4 F (36.9 C)  08/04/19 (!) 97.5 F (36.4 C)   BP Readings from Last 3 Encounters:  02/08/23 124/67  10/12/22 128/70  04/25/22 136/80   Pulse Readings from Last 3 Encounters:  02/08/23 62  10/12/22 (!) 59  04/25/22 73    /10  In general this is a well appearing Caucasian male in no acute distress. He's alert and oriented x4 and appropriate throughout the examination. Cardiopulmonary assessment is negative for acute distress, and he exhibits normal effort.     KPS = 100  100 - Normal; no complaints; no evidence of disease. 90   - Able to carry on normal activity; minor signs or symptoms of disease. 80   - Normal activity with effort; some signs or symptoms of disease. 68   - Cares for self; unable to carry on normal activity or to do active work. 60   - Requires occasional  assistance, but is able to care for most of his personal needs. 50   - Requires considerable assistance and frequent medical care. 40   - Disabled; requires special care and assistance. 30   - Severely disabled; hospital admission is indicated although death not imminent. 20   - Very sick; hospital admission necessary; active supportive treatment necessary. 10   - Moribund; fatal processes progressing rapidly. 0     - Dead  Karnofsky DA, Abelmann WH, Craver LS and Burchenal [facility] [phone]) The use of the nitrogen mustards in the palliative treatment of carcinoma: with particular reference to bronchogenic carcinoma Cancer 1 634-56  LABORATORY DATA:  Lab Results  Component Value Date   WBC 5.5 12/29/2018   HGB 17.2 12/29/2018   HCT 48.4 12/29/2018   MCV 87 12/29/2018   PLT 155 12/29/2018   Lab Results  Component Value Date   NA 137 12/29/2018   K 5.3 (H) 12/29/2018   CL 96 12/29/2018   CO2 25 12/29/2018   Lab Results  Component Value Date   ALT 11 12/29/2018   AST 13 12/29/2018   ALKPHOS 111 12/29/2018   BILITOT 0.4 12/29/2018     RADIOGRAPHY: No results found.    IMPRESSION/PLAN: 1. 72 y.o. gentleman with Stage T2a adenocarcinoma of the prostate with Gleason score of 3+4, and PSA of 4.77.  The patient has elected to proceed with seed implant for treatment of his disease. We reviewed the risks, benefits, short and long-term effects associated with brachytherapy and discussed the role of SpaceOAR in reducing the rectal toxicity associated with radiotherapy.  He appears to have a good understanding of his disease and our treatment recommendations which are of curative intent.  He was encouraged to ask questions that were answered to his stated satisfaction. He has freely signed written consent to proceed today in the office and a copy of this  document will be placed in his medical record. His procedure is tentatively scheduled for 05/14/23 in collaboration with Dr. Arita Miss and we will see him  back for his post-procedure visit approximately 3 weeks thereafter. We look forward to continuing to participate in his care. He knows that he is welcome to call with any questions or concerns at any time in the interim.  I personally spent 30 minutes in this encounter including chart review, reviewing radiological studies, meeting face-to-face with the patient, entering orders and completing documentation.    Marguarite Arbour, MMS, PA-C [facility] Radiation Oncology Physician Assistant Direct Dial: [phone]  Fax: [phone]

## 2023-04-25 ENCOUNTER — Encounter: Payer: Self-pay | Admitting: Urology

## 2023-04-25 ENCOUNTER — Ambulatory Visit
Admission: RE | Admit: 2023-04-25 | Discharge: 2023-04-25 | Disposition: A | Discharge status: Home / Self Care | Payer: PPO | Source: Ambulatory Visit | Attending: Urology | Admitting: Urology

## 2023-04-25 ENCOUNTER — Ambulatory Visit
Admission: RE | Admit: 2023-04-25 | Discharge: 2023-04-25 | Disposition: A | Discharge status: Home / Self Care | Payer: PPO | Source: Ambulatory Visit | Attending: Radiation Oncology | Admitting: Radiation Oncology

## 2023-04-25 VITALS — Ht 67.0 in | Wt 135.0 lb

## 2023-04-25 DIAGNOSIS — C61 Malignant neoplasm of prostate: Secondary | ICD-10-CM | POA: Diagnosis not present

## 2023-04-25 DIAGNOSIS — Z191 Hormone sensitive malignancy status: Secondary | ICD-10-CM | POA: Diagnosis not present

## 2023-04-25 NOTE — Progress Notes (Signed)
Pre-seed nursing interview for a diagnosis of Stage T2a adenocarcinoma of the prostate with Gleason score of 3+4, and PSA of 4.77.  Patient identity verified x2.   Patient reports doing well. No issues conveyed at this time.  Meaningful use complete.  Urinary Management medication(s)- None Urology appointment date- Pending, with Dr. Arita Miss at [facility]  Ht 5\' 7"  (1.702 m)   Wt 135 lb (61.2 kg)   BMI 21.14 kg/m   This concludes the interaction.  Ruel Favors, LPN

## 2023-04-26 ENCOUNTER — Other Ambulatory Visit: Payer: Self-pay | Admitting: Urology

## 2023-05-07 ENCOUNTER — Encounter (HOSPITAL_BASED_OUTPATIENT_CLINIC_OR_DEPARTMENT_OTHER): Payer: Self-pay | Admitting: Urology

## 2023-05-07 NOTE — Progress Notes (Signed)
 Spoke w/ via phone for pre-op interview--- [facility] Lab needs dos---- NONE        Lab results------EKG current in Epic dated 10/12/22. COVID test -----patient states asymptomatic no test needed Arrive at ------- NPO after MN NO Solid Food.  Clear liquids from MN until---0630 Med rec completed Medications to take morning of surgery -----Metoprolol , Prilosec and Pepcid. Diabetic medication ----- Patient instructed no nail polish to be worn day of surgery Patient instructed to bring photo id and insurance card day of surgery Patient aware to have Driver (ride ) / caregiver    for 24 hours after surgery - Diance Conrad Patient Special Instructions ----- Fleets enema AM of surgery. Pre-Op special Instructions ----- Patient verbalized understanding of instructions that were given at this phone interview. Patient denies chest pain, sob, fever, cough at the interview.

## 2023-05-10 NOTE — Progress Notes (Signed)
 RN left message for call back to assess any questions or barriers to upcoming brachytherapy.

## 2023-05-10 NOTE — H&P (Signed)
 CC/HPI: cc: Prostate cancer   01/16/2023: 73 year old man referred for an elevated PSA 4.83 underwent a MR fusion biopsy.    PSA 4.83  MR fusion biopsy: Gleason 3+4= 7 in 3/4 cores region of interest  Gleason 3+3 = 6 in 1/12 core standard biopsy   MSK preradical prostatectomy nomogram  15-year space 98%  5-year-82, 10-year-70  OC 62  ECE 37  LN 3  SVI 3     ALLERGIES: None   MEDICATIONS: Metoprolol  Tartrate  Omeprazole  Centrum Silver  Clobetasol  Propionate  Diclofenac  Excedrin Migraine  Famotidine  Fluticasone Propionate  Guaifenesin  Simethicone  Sudafed     GU PSH: Prostate Needle Biopsy - 01/09/2023       PSH Notes: Spinal surgery (2020)   NON-GU PSH: Sinus surgery, 1985 Surgical Pathology, Gross And Microscopic Examination For Prostate Needle - 01/09/2023 Tonsillectomy, 1960         GU PMH: Elevated PSA - 01/09/2023, - 11/02/2022 Prostate nodule w/o LUTS - 01/09/2023, - 11/02/2022 Microscopic hematuria - 11/02/2022    NON-GU PMH: Arrhythmia Arthritis GERD Hypertension    FAMILY HISTORY: 1 Daughter - Daughter 1 son - Son Heart Disease - Father, Grandfather stroke - Mother   SOCIAL HISTORY: Marital Status: Single Preferred Language: English; Ethnicity: Not Hispanic Or Latino; Race: White Current Smoking Status: Patient has never smoked.  <DIV'  Tobacco Use Assessment Completed:  Used Tobacco in last 30 days?   Has never drank.  Does not drink caffeine. Patient's occupation Garment/textile Technologist, programmer, multimedia.    REVIEW OF SYSTEMS:    GU Review Male:  Patient denies frequent urination, hard to postpone urination, burning/ pain with urination, get up at night to urinate, leakage of urine, stream starts and stops, trouble starting your stream, have to strain to urinate , erection problems, and penile pain.   Gastrointestinal (Upper):  Patient denies nausea, vomiting, and indigestion/ heartburn.   Gastrointestinal (Lower):  Patient denies diarrhea and constipation.    Constitutional:  Patient denies fever, night sweats, weight loss, and fatigue.   Skin:  Patient denies skin rash/ lesion and itching.   Eyes:  Patient denies blurred vision and double vision.   Ears/ Nose/ Throat:  Patient denies sore throat and sinus problems.   Hematologic/Lymphatic:  Patient denies swollen glands and easy bruising.   Cardiovascular:  Patient denies leg swelling and chest pains.   Respiratory:  Patient denies cough and shortness of breath.   Endocrine:  Patient denies excessive thirst.   Musculoskeletal:  Patient denies back pain and joint pain.   Neurological:  Patient denies headaches and dizziness.   Psychologic:  Patient denies depression and anxiety.   VITAL SIGNS: None   MULTI-SYSTEM PHYSICAL EXAMINATION:    Constitutional: Well-nourished. No physical deformities. Normally developed. Good grooming.   Neck: Neck symmetrical, not swollen. Normal tracheal position.   Respiratory: No labored breathing, no use of accessory muscles.    Skin: No paleness, no jaundice, no cyanosis. No lesion, no ulcer, no rash.   Neurologic / Psychiatric: Oriented to time, oriented to place, oriented to person. No depression, no anxiety, no agitation.   Eyes: Normal conjunctivae. Normal eyelids.   Ears, Nose, Mouth, and Throat: Left ear no scars, no lesions, no masses. Right ear no scars, no lesions, no masses. Nose no scars, no lesions, no masses. Normal hearing. Normal lips.   Musculoskeletal: Normal gait and station of head and neck.        Complexity of Data:   Lab Test  Review:  Path Report  Records Review:  Previous Patient Records, POC Tool   PROCEDURES:    Urinalysis w/Scope  Dipstick Dipstick Cont'd Micro  Color: Yellow Bilirubin: Neg mg/dL WBC/hpf: 0 - 5/hpf  Appearance: Clear Ketones: Neg mg/dL RBC/hpf: 10 - 79/yeq  Specific Gravity: 1.015 Blood: 3+ ery/uL Bacteria: Rare (0-9/hpf)  pH: 6.5 Protein: Neg mg/dL Cystals: NS (Not Seen)  Glucose: Neg mg/dL Urobilinogen: 0.2  mg/dL Casts: NS (Not Seen)   Nitrites: Neg Trichomonas: Not Present   Leukocyte Esterase: Neg leu/uL Mucous: Present    Epithelial Cells: 0 - 5/hpf    Yeast: NS (Not Seen)    Sperm: Not Present    ASSESSMENT:     ICD-10 Details  1 GU:  Prostate Cancer - C61 Undiagnosed New Problem  2  Prostate nodule w/o LUTS - N40.2 Chronic, Stable   PLAN:   Document  Letter(s):  Created for Patient: Clinical Summary   Notes:  1. Prostate cancer: cT2aNxMx, Gleason 3+4= 7, grade group 2, favorable intermediate risk prostate cancer  -Patient was given a copy of his pathology report, patient information from [facility] on early-stage prostate cancer, and copy of preradical prostatectomy MSK nomogram  -we reviewed treatment options including radical prostatectomy as well as radiation  -We discussed how surgery is performed as well as postoperative recovery and side effects including urinary incontinence and ED  -Patient wishes to proceed with radiation and will be referred to Dr. Patrcia

## 2023-05-13 ENCOUNTER — Telehealth: Payer: Self-pay | Admitting: *Deleted

## 2023-05-13 NOTE — Telephone Encounter (Signed)
 CALLED PATIENT TO REMIND OF PROCEDURE FOR 05-14-23, SPOKE WITH PATIENT AND HE IS AWARE OF THIS PROCEDURE

## 2023-05-14 ENCOUNTER — Ambulatory Visit (HOSPITAL_BASED_OUTPATIENT_CLINIC_OR_DEPARTMENT_OTHER): Payer: PPO | Admitting: Certified Registered"

## 2023-05-14 ENCOUNTER — Other Ambulatory Visit: Payer: Self-pay

## 2023-05-14 ENCOUNTER — Ambulatory Visit (HOSPITAL_BASED_OUTPATIENT_CLINIC_OR_DEPARTMENT_OTHER)
Admission: RE | Admit: 2023-05-14 | Discharge: 2023-05-14 | Disposition: A | Discharge status: Home / Self Care | Payer: PPO | Source: Ambulatory Visit | Attending: Urology | Admitting: Urology

## 2023-05-14 ENCOUNTER — Ambulatory Visit (HOSPITAL_COMMUNITY): Payer: PPO

## 2023-05-14 ENCOUNTER — Encounter (HOSPITAL_BASED_OUTPATIENT_CLINIC_OR_DEPARTMENT_OTHER)
Admission: RE | Disposition: A | Discharge status: Home / Self Care | Payer: Self-pay | Source: Ambulatory Visit | Attending: Urology

## 2023-05-14 ENCOUNTER — Encounter (HOSPITAL_BASED_OUTPATIENT_CLINIC_OR_DEPARTMENT_OTHER): Payer: Self-pay | Admitting: Urology

## 2023-05-14 DIAGNOSIS — I10 Essential (primary) hypertension: Secondary | ICD-10-CM | POA: Insufficient documentation

## 2023-05-14 DIAGNOSIS — R519 Headache, unspecified: Secondary | ICD-10-CM | POA: Diagnosis not present

## 2023-05-14 DIAGNOSIS — K219 Gastro-esophageal reflux disease without esophagitis: Secondary | ICD-10-CM | POA: Diagnosis not present

## 2023-05-14 DIAGNOSIS — C61 Malignant neoplasm of prostate: Secondary | ICD-10-CM

## 2023-05-14 DIAGNOSIS — Z79899 Other long term (current) drug therapy: Secondary | ICD-10-CM | POA: Diagnosis not present

## 2023-05-14 HISTORY — PX: SPACE OAR INSTILLATION: SHX6769

## 2023-05-14 HISTORY — DX: Malignant (primary) neoplasm, unspecified: C80.1

## 2023-05-14 HISTORY — PX: RADIOACTIVE SEED IMPLANT: SHX5150

## 2023-05-14 SURGERY — INSERTION, RADIATION SOURCE, PROSTATE
Anesthesia: General | Site: Prostate

## 2023-05-14 MED ORDER — WHITE PETROLATUM EX OINT
TOPICAL_OINTMENT | CUTANEOUS | Status: AC
  Filled 2023-05-14: qty 5

## 2023-05-14 MED ORDER — TRAMADOL HCL 50 MG PO TABS: 50.0000 mg | ORAL_TABLET | Freq: Four times a day (QID) | ORAL | 0 refills | Status: DC | PRN

## 2023-05-14 MED ORDER — ROCURONIUM BROMIDE 10 MG/ML (PF) SYRINGE
PREFILLED_SYRINGE | INTRAVENOUS | Status: AC
Start: 2023-05-14 — End: ?
  Filled 2023-05-14: qty 10

## 2023-05-14 MED ORDER — ONDANSETRON HCL 4 MG/2ML IJ SOLN
INTRAMUSCULAR | Status: DC | PRN
  Administered 2023-05-14: 4 mg via INTRAVENOUS

## 2023-05-14 MED ORDER — SODIUM CHLORIDE 0.9 % IV SOLN: INTRAVENOUS | Status: DC

## 2023-05-14 MED ORDER — CIPROFLOXACIN IN D5W 400 MG/200ML IV SOLN
INTRAVENOUS | Status: AC
  Filled 2023-05-14: qty 200

## 2023-05-14 MED ORDER — DEXAMETHASONE SODIUM PHOSPHATE 10 MG/ML IJ SOLN
INTRAMUSCULAR | Status: DC | PRN
  Administered 2023-05-14: 5 mg via INTRAVENOUS

## 2023-05-14 MED ORDER — FENTANYL CITRATE (PF) 100 MCG/2ML IJ SOLN
25.0000 ug | INTRAMUSCULAR | Status: DC | PRN
Start: 2023-05-14 — End: 2023-05-14

## 2023-05-14 MED ORDER — FLEET ENEMA RE ENEM: 1.0000 | ENEMA | Freq: Once | RECTAL | Status: DC

## 2023-05-14 MED ORDER — GLYCOPYRROLATE PF 0.2 MG/ML IJ SOSY
PREFILLED_SYRINGE | INTRAMUSCULAR | Status: DC | PRN
  Administered 2023-05-14: .2 mg via INTRAVENOUS

## 2023-05-14 MED ORDER — DEXAMETHASONE SODIUM PHOSPHATE 10 MG/ML IJ SOLN
INTRAMUSCULAR | Status: AC
  Filled 2023-05-14: qty 1

## 2023-05-14 MED ORDER — FENTANYL CITRATE (PF) 100 MCG/2ML IJ SOLN
INTRAMUSCULAR | Status: AC
  Filled 2023-05-14: qty 2

## 2023-05-14 MED ORDER — SUGAMMADEX SODIUM 200 MG/2ML IV SOLN
INTRAVENOUS | Status: DC | PRN
  Administered 2023-05-14: 150 mg via INTRAVENOUS

## 2023-05-14 MED ORDER — STERILE WATER FOR IRRIGATION IR SOLN
Status: DC | PRN
  Administered 2023-05-14: 500 mL

## 2023-05-14 MED ORDER — ONDANSETRON HCL 4 MG/2ML IJ SOLN
INTRAMUSCULAR | Status: AC
  Filled 2023-05-14: qty 2

## 2023-05-14 MED ORDER — PROPOFOL 10 MG/ML IV BOLUS
INTRAVENOUS | Status: AC
  Filled 2023-05-14: qty 20

## 2023-05-14 MED ORDER — GLYCOPYRROLATE PF 0.2 MG/ML IJ SOSY
PREFILLED_SYRINGE | INTRAMUSCULAR | Status: AC
  Filled 2023-05-14: qty 1

## 2023-05-14 MED ORDER — ROCURONIUM BROMIDE 10 MG/ML (PF) SYRINGE
PREFILLED_SYRINGE | INTRAVENOUS | Status: DC | PRN
  Administered 2023-05-14: 40 mg via INTRAVENOUS
  Administered 2023-05-14: 10 mg via INTRAVENOUS

## 2023-05-14 MED ORDER — CIPROFLOXACIN IN D5W 400 MG/200ML IV SOLN
400.0000 mg | INTRAVENOUS | Status: AC
  Administered 2023-05-14: 400 mg via INTRAVENOUS

## 2023-05-14 MED ORDER — CEPHALEXIN 500 MG PO CAPS: 500.0000 mg | ORAL_CAPSULE | Freq: Every day | ORAL | 0 refills | Status: AC

## 2023-05-14 MED ORDER — SODIUM CHLORIDE 0.9 % IR SOLN
Status: DC | PRN
  Administered 2023-05-14: 500 mL via INTRAVESICAL

## 2023-05-14 MED ORDER — FENTANYL CITRATE (PF) 100 MCG/2ML IJ SOLN
INTRAMUSCULAR | Status: DC | PRN
  Administered 2023-05-14: 75 ug via INTRAVENOUS
  Administered 2023-05-14: 25 ug via INTRAVENOUS

## 2023-05-14 MED ORDER — PROPOFOL 10 MG/ML IV BOLUS
INTRAVENOUS | Status: DC | PRN
  Administered 2023-05-14: 130 mg via INTRAVENOUS

## 2023-05-14 MED ORDER — LIDOCAINE 2% (20 MG/ML) 5 ML SYRINGE
INTRAMUSCULAR | Status: DC | PRN
  Administered 2023-05-14: 60 mg via INTRAVENOUS

## 2023-05-14 MED ORDER — LIDOCAINE HCL (PF) 2 % IJ SOLN
INTRAMUSCULAR | Status: AC
  Filled 2023-05-14: qty 5

## 2023-05-14 MED ORDER — IOHEXOL 300 MG/ML  SOLN
INTRAMUSCULAR | Status: DC | PRN
  Administered 2023-05-14: 7 mL via URETHRAL

## 2023-05-14 MED ORDER — LACTATED RINGERS IV SOLN: INTRAVENOUS | Status: DC | PRN

## 2023-05-14 SURGICAL SUPPLY — 42 items
BAG URINE DRAIN 2000ML AR STRL (UROLOGICAL SUPPLIES) ×2 IMPLANT
BLADE CLIPPER SENSICLIP SURGIC (BLADE) ×2 IMPLANT
CATH FOLEY 2WAY SLVR 5CC 16FR (CATHETERS) ×2 IMPLANT
CATH ROBINSON RED A/P 16FR (CATHETERS) IMPLANT
CATH ROBINSON RED A/P 20FR (CATHETERS) ×2 IMPLANT
CLOTH BEACON ORANGE TIMEOUT ST (SAFETY) ×2 IMPLANT
CNTNR URN SCR LID CUP LEK RST (MISCELLANEOUS) ×2 IMPLANT
COVER BACK TABLE 60X90IN (DRAPES) ×2 IMPLANT
COVER MAYO STAND STRL (DRAPES) ×2 IMPLANT
DRSG TEGADERM 4X4.75 (GAUZE/BANDAGES/DRESSINGS) ×2 IMPLANT
DRSG TEGADERM 8X12 (GAUZE/BANDAGES/DRESSINGS) ×2 IMPLANT
GAUZE SPONGE 4X4 12PLY STRL LF (GAUZE/BANDAGES/DRESSINGS) IMPLANT
GLOVE BIO SURGEON STRL SZ 6.5 (GLOVE) ×2 IMPLANT
GLOVE BIO SURGEON STRL SZ7.5 (GLOVE) IMPLANT
GLOVE BIO SURGEON STRL SZ8 (GLOVE) IMPLANT
GLOVE BIOGEL PI IND STRL 6.5 (GLOVE) ×2 IMPLANT
GLOVE SURG ORTHO 8.5 STRL (GLOVE) IMPLANT
GOWN STRL REUS W/TWL LRG LVL3 (GOWN DISPOSABLE) ×2 IMPLANT
GRID BRACH TEMP 18GA 2.8X3X.75 (MISCELLANEOUS) ×2 IMPLANT
HOLDER FOLEY CATH W/STRAP (MISCELLANEOUS) ×2 IMPLANT
I-125 Seeds IMPLANT
IMPL SPACEOAR VUE SYSTEM (Spacer) ×2 IMPLANT
IMPLANT SPACEOAR VUE SYSTEM (Spacer) ×1 IMPLANT
IV NS 1000ML BAXH (IV SOLUTION) ×2 IMPLANT
IV NS 500ML BAXH (IV SOLUTION) IMPLANT
KIT TURNOVER CYSTO (KITS) ×2 IMPLANT
NDL BRACHY 18G 5PK (NEEDLE) ×8 IMPLANT
NDL BRACHY 18G SINGLE (NEEDLE) IMPLANT
NDL PK MORGANSTERN STABILIZ (NEEDLE) ×2 IMPLANT
NEEDLE BRACHY 18G 5PK (NEEDLE) ×4 IMPLANT
NEEDLE BRACHY 18G SINGLE (NEEDLE) IMPLANT
NEEDLE PK MORGANSTERN STABILIZ (NEEDLE) ×1 IMPLANT
PACK CYSTO (CUSTOM PROCEDURE TRAY) ×2 IMPLANT
SHEATH ULTRASOUND LF (SHEATH) IMPLANT
SHEATH ULTRASOUND LTX NONSTRL (SHEATH) IMPLANT
SLEEVE SCD COMPRESS KNEE MED (STOCKING) ×2 IMPLANT
SUT BONE WAX W31G (SUTURE) IMPLANT
SYR 10ML LL (SYRINGE) ×2 IMPLANT
SYR CONTROL 10ML LL (SYRINGE) ×2 IMPLANT
TOWEL OR 17X24 6PK STRL BLUE (TOWEL DISPOSABLE) ×2 IMPLANT
UNDERPAD 30X36 HEAVY ABSORB (UNDERPADS AND DIAPERS) ×4 IMPLANT
WATER STERILE IRR 500ML POUR (IV SOLUTION) ×2 IMPLANT

## 2023-05-14 NOTE — Op Note (Signed)
 PATIENT:  Ethan Santana  PRE-OPERATIVE DIAGNOSIS:  Adenocarcinoma of the prostate  POST-OPERATIVE DIAGNOSIS:  Same  PROCEDURE:  1. I-125 radioactive seed implantation 2. Cystoscopy  3. Placement of SpaceOAR 4. Fluoroscopy use with time less than 1 hour.  SURGEON:  Surgeon(s): Valli Shank, MD  Radiation oncologist: Dr. Donnice Barge  ANESTHESIA:  General  EBL:  Minimal  DRAINS: None  INDICATION: Ethan Santana  Description of procedure: After informed consent the patient was brought to the major OR, placed on the table and administered general anesthesia. He was then moved to the modified lithotomy position with his perineum perpendicular to the floor. His perineum and genitalia were then sterilely prepped. An official timeout was then performed. A 16 French Foley catheter was then placed in the bladder and filled with dilute contrast, a rectal tube was placed in the rectum and the transrectal ultrasound probe was placed in the rectum and affixed to the stand. He was then sterilely draped.  Real time ultrasonography was used along with the seed planning software On[employer] Prostate vs. 4.2.2.4. This was used to develop the seed plan including the number of needles as well as number of seeds required for complete and adequate coverage.  The needles were then preloaded with seeds and spacers according to the previously developed plan.  Real-time ultrasonography was then used along with the previously developed plan to implant a total of 70 seeds using 17 needles. This proceeded without difficulty or complication.   I then proceeded with placement of SpaceOAR by introducing a needle with the bevel angled inferiorly approximately 2 cm superior to the anus. This was angled downward and under direct ultrasound was placed within the space between the prostatic capsule and rectum. This was confirmed with a small amount of sterile saline injected and this was performed under direct  ultrasound. I then attached the SpaceOAR to the needle and injected this in the space between the prostate and rectum with good placement noted.  A Foley catheter was then removed as well as the transrectal ultrasound probe and rectal probe. Flexible cystoscopy was then performed using the 17 French flexible scope which revealed a normal urethra throughout its length down to the sphincter which appeared intact. The prostatic urethra revealed bilobar hypertrophy but no evidence of obstruction, seeds, spacers or lesions. The bladder was then entered and fully and systematically inspected. The ureteral orifices were noted to be of normal configuration and position. The mucosa revealed no evidence of tumors. There were also no stones identified within the bladder. I noted no seeds or spacers on the floor of the bladder and retroflexion of the scope revealed no seeds protruding from the base of the prostate.  C-arm fluoroscopy was then used to evaluate the distribution of seeds placement and aid in determining confirmation of the number of seeds placed.  Real-time fluoroscopy was used with saved images revealing the location of the seeds placed both in AP and oblique views.  The cystoscope was then removed and the patient was awakened and taken to recovery room in stable and satisfactory condition. He tolerated procedure well and there were no intraoperative complications.

## 2023-05-14 NOTE — Transfer of Care (Signed)
 Immediate Anesthesia Transfer of Care Note  Patient: [name]  Procedure(s) Performed: Procedure(s) (LRB): RADIOACTIVE SEED IMPLANT/BRACHYTHERAPY IMPLANT (N/A) SPACE OAR INSTILLATION (N/A)  Patient Location: PACU  Anesthesia Type: General  Level of Consciousness: awake, oriented, sedated and patient cooperative  Airway & Oxygen Therapy: Patient Spontanous Breathing and Patient connected to face mask oxygen  Post-op Assessment: Report given to PACU RN and Post -op Vital signs reviewed and stable  Post vital signs: Reviewed and stable  Complications: No apparent anesthesia complications Last Vitals:  Vitals Value Taken Time  BP 131/80 05/14/23 1125  Temp    Pulse 84 05/14/23 1127  Resp 14 05/14/23 1127  SpO2 97 % 05/14/23 1127  Vitals shown include unfiled device data.  Last Pain:  Vitals:   05/14/23 0801  TempSrc: Oral  PainSc: 0-No pain      Patients Stated Pain Goal: 7 (05/14/23 0801)  Complications: No notable events documented.

## 2023-05-14 NOTE — Anesthesia Postprocedure Evaluation (Signed)
 Anesthesia Post Note  Patient: Ethan Santana  Procedure(s) Performed: RADIOACTIVE SEED IMPLANT/BRACHYTHERAPY IMPLANT (Prostate) SPACE OAR INSTILLATION (Prostate)     Patient location during evaluation: PACU Anesthesia Type: General Level of consciousness: awake and alert Pain management: pain level controlled Vital Signs Assessment: post-procedure vital signs reviewed and stable Respiratory status: spontaneous breathing, nonlabored ventilation and respiratory function stable Cardiovascular status: blood pressure returned to baseline and stable Postop Assessment: no apparent nausea or vomiting Anesthetic complications: no  No notable events documented.  Last Vitals:  Vitals:   05/14/23 1125 05/14/23 1200  BP: 131/80 124/75  Pulse: 91 79  Resp: 14 16  Temp:    SpO2: 98% 98%    Last Pain:  Vitals:   05/14/23 1200  TempSrc:   PainSc: 0-No pain                 Mahdiya Mossberg,W. EDMOND

## 2023-05-14 NOTE — Progress Notes (Signed)
  Radiation Oncology         [phone] ________________________________  Name: Ethan Santana MRN: [id number]  Date: 05/14/2023  DOB: 04-13-1951       Prostate Seed Implant  RR:Tzoanmw, Ryan, DO  No ref. provider found  DIAGNOSIS:  73 y.o. gentleman with Stage T2a adenocarcinoma of the prostate with Gleason score of 3+4, and PSA of 4.77.  Oncology History  Malignant neoplasm of prostate (HCC)  01/09/2023 Cancer Staging   Staging form: Prostate, AJCC 8th Edition - Clinical stage from 01/09/2023: Stage IIB (cT2a, cN0, cM0, PSA: 4.8, Grade Group: 2) - Signed by Sherwood Rise, PA-C on 02/08/2023 Histopathologic type: Adenocarcinoma, NOS Stage prefix: Initial diagnosis Prostate specific antigen (PSA) range: Less than 10 Gleason primary pattern: 3 Gleason secondary pattern: 4 Gleason score: 7 Histologic grading system: 5 grade system Number of biopsy cores examined: 16 Number of biopsy cores positive: 4 Location of positive needle core biopsies: One side   02/08/2023 Initial Diagnosis   Malignant neoplasm of prostate (HCC)     No diagnosis found.  PROCEDURE: Insertion of radioactive I-125 seeds into the prostate gland.  RADIATION DOSE: 145 Gy, definitive therapy.  TECHNIQUE: Ethan Santana was brought to the operating room with the urologist. He was placed in the dorsolithotomy position. He was catheterized and a rectal tube was inserted. The perineum was shaved, prepped and draped. The ultrasound probe was then introduced by me into the rectum to see the prostate gland.  TREATMENT DEVICE: I attached the needle grid to the ultrasound probe stand and anchor needles were placed.  3D PLANNING: The prostate was imaged in 3D using a sagittal sweep of the prostate probe. These images were transferred to the planning computer. There, the prostate, urethra and rectum were defined on each axial reconstructed image. Then, the software created an optimized 3D plan and a few seed positions  were adjusted. The quality of the plan was reviewed using [facility] information for the target and the following two organs at risk:  Urethra and Rectum.  Then the accepted plan was printed and handed off to the radiation therapist.  Under my supervision, the custom loading of the seeds and spacers was carried out using the quick loader.  These pre-loaded needles were then placed into the needle holder.SABRA  PROSTATE VOLUME STUDY:  Using transrectal ultrasound the volume of the prostate was verified to be 47.5 cc.  SPECIAL TREATMENT PROCEDURE/SUPERVISION AND HANDLING: The pre-loaded needles were then delivered by the urologist under sagittal guidance. A total of 17 needles were used to deposit 70 seeds in the prostate gland. The individual seed activity was 0.444 mCi.  SpaceOAR:  Yes  COMPLEX SIMULATION: At the end of the procedure, an anterior radiograph of the pelvis was obtained to document seed positioning and count. Cystoscopy was performed by the urologist to check the urethra and bladder.  MICRODOSIMETRY: At the end of the procedure, the patient was emitting 0.184 mR/hr at 1 meter. Accordingly, he was considered safe for hospital discharge.  PLAN: The patient will return to the radiation oncology clinic for post implant CT dosimetry in three weeks.   ________________________________  Donnice FELIX Patrcia, M.D.

## 2023-05-14 NOTE — Anesthesia Procedure Notes (Signed)
 Procedure Name: Intubation Date/Time: 05/14/2023 10:05 AM  Performed by: Delayne Olam BIRCH, CRNAPre-anesthesia Checklist: Patient identified, Emergency Drugs available, Suction available and Patient being monitored Patient Re-evaluated:Patient Re-evaluated prior to induction Oxygen Delivery Method: Circle system utilized Preoxygenation: Pre-oxygenation with 100% oxygen Induction Type: IV induction Ventilation: Mask ventilation without difficulty Laryngoscope Size: Mac and 4 Grade View: Grade I Tube type: Oral Tube size: 7.5 mm Number of attempts: 1 Airway Equipment and Method: Stylet and Oral airway Placement Confirmation: ETT inserted through vocal cords under direct vision, positive ETCO2 and breath sounds checked- equal and bilateral Secured at: 23 cm Tube secured with: Tape Dental Injury: Teeth and Oropharynx as per pre-operative assessment

## 2023-05-14 NOTE — Interval H&P Note (Signed)
 History and Physical Interval Note:  05/14/2023 8:01 AM  Ethan Santana  has presented today for surgery, with the diagnosis of PROSTATE CANCER.  The various methods of treatment have been discussed with the patient and family. After consideration of risks, benefits and other options for treatment, the patient has consented to  Procedure(s) with comments: RADIOACTIVE SEED IMPLANT/BRACHYTHERAPY IMPLANT (N/A) - 90 MINUTES SPACE OAR INSTILLATION (N/A) as a surgical intervention.  The patient's history has been reviewed, patient examined, no change in status, stable for surgery.  I have reviewed the patient's chart and labs.  Questions were answered to the patient's satisfaction.     Damiah Mcdonald D Hazen Brumett

## 2023-05-14 NOTE — Anesthesia Preprocedure Evaluation (Addendum)
 Anesthesia Evaluation  Patient identified by MRN, date of birth, ID band Patient awake    Reviewed: Allergy & Precautions, H&P , NPO status , Patient's Chart, lab work & pertinent test results, reviewed documented beta blocker date and time   Airway Mallampati: II  TM Distance: >3 FB Neck ROM: Full    Dental no notable dental hx. (+) Teeth Intact, Dental Advisory Given   Pulmonary neg pulmonary ROS   Pulmonary exam normal breath sounds clear to auscultation       Cardiovascular hypertension, Pt. on medications and Pt. on home beta blockers  Rhythm:Regular Rate:Normal     Neuro/Psych  Headaches, Seizures -, Well Controlled,   negative psych ROS   GI/Hepatic Neg liver ROS,GERD  Medicated,,  Endo/Other  negative endocrine ROS    Renal/GU negative Renal ROS  negative genitourinary   Musculoskeletal  (+) Arthritis , Osteoarthritis,    Abdominal   Peds  Hematology negative hematology ROS (+)   Anesthesia Other Findings   Reproductive/Obstetrics negative OB ROS                             Anesthesia Physical Anesthesia Plan  ASA: 2  Anesthesia Plan: General   Post-op Pain Management: Ofirmev  IV (intra-op)*   Induction: Intravenous  PONV Risk Score and Plan: 3 and Ondansetron , Dexamethasone  and Midazolam   Airway Management Planned: Oral ETT and LMA  Additional Equipment:   Intra-op Plan:   Post-operative Plan: Extubation in OR  Informed Consent: I have reviewed the patients History and Physical, chart, labs and discussed the procedure including the risks, benefits and alternatives for the proposed anesthesia with the patient or authorized representative who has indicated his/her understanding and acceptance.     Dental advisory given  Plan Discussed with: CRNA  Anesthesia Plan Comments:        Anesthesia Quick Evaluation

## 2023-05-14 NOTE — Discharge Instructions (Addendum)
DISCHARGE INSTRUCTIONS FOR PROSTATE SEED IMPLANTATION ° °Antibiotics °You may be given a prescription for an antibiotic to take when you arrive home. If so, be sure to take every tablet in the bottle, even if you are feeling better before the prescription is finished. If you begin itching, notice a rash or start to swell on your trunk, arms, legs and/or throat, immediately stop taking the antibiotic and call your Urologist. °Diet °Resume your usual diet when you return home. To keep your bowels moving easily and softly, drink prune, apple and cranberry juice at room temperature. You may also take a stool softener, such as Colace, which is available without prescription at local pharmacies. °Daily activities °? No driving or heavy lifting for at least two days after the implant. °? No bike riding, horseback riding or riding lawn mowers for the first month after the implant. °? Any strenuous physical activity should be approved by your doctor before you resume it. °Sexual relations °You may resume sexual relations two weeks after the procedure. A condom should be used for the first two weeks. Your semen may be dark brown or black; this is normal and is related bleeding that may have occurred during the implant. °Postoperative swelling °Expect swelling and bruising of the scrotum and perineum (the area between the scrotum and anus). Both the swelling and the bruising should resolve in l or 2 weeks. Ice packs and over- the-counter medications such as Tylenol, Advil or Aleve may lessen your discomfort. °Postoperative urination °Most men experience burning on urination and/or urinary frequency. If this becomes bothersome, contact your Urologist.  Medication can be prescribed to relieve these problems.  It is normal to have some blood in your urine for a few days after the implant. °Special instructions related to the seeds °It is unlikely that you will pass an Iodine-125 seed in your urine. The seeds are silver in color  and are about as large as a grain of rice. If you pass a seed, do not handle it with your fingers. Use a spoon to place it in an envelope or jar in place this in base occluded area such as the garage or basement for return to the radiation clinic at your convenience. ° °Contact your doctor for °? Temperature greater than 101 F °? Increasing pain °? Inability to urinate °Follow-up ° You should have follow up with your urologist and radiation oncologist about 3 weeks after the procedure. °General information regarding Iodine seeds °? Iodine-125 is a low energy radioactive material. It is not deeply penetrating and loses energy at short distances. Your prostate will absorb the radiation. Objects that are touched or used by the patient do not become radioactive. °? Body wastes (urine and stool) or body fluids (saliva, tears, semen or blood) are not radioactive. °? The Nuclear Regulatory Commission (NRC) has determined that no radiation precautions are needed for patients undergoing Iodine-125 seed implantation. The NRC states that such patients do not present a risk to the people around them, including young children and pregnant women. However, in keeping with the general principle that radiation exposure should be kept as low reasonably possible, we suggest the following: °? Children and pets should not sit on the patient's lap for the first two (2) weeks after the implant. °? Pregnant (or possibly pregnant) women should avoid prolonged, close contact with the patient for the first two (2) weeks after the implant. °? A distance of three (3) feet is acceptable. °? At a distance of three (3)   with the patient.  Post Anesthesia Home Care Instructions  Activity: Get plenty of rest for the remainder of the day. A responsible adult should stay with you for 24 hours following the procedure.  For the next 24 hours, DO NOT: -Drive a car -Operate machinery -Drink alcoholic beverages -Take  any medication unless instructed by your physician -Make any legal decisions or sign important papers.  Meals: Start with liquid foods such as gelatin or soup. Progress to regular foods as tolerated. Avoid greasy, spicy, heavy foods. If nausea and/or vomiting occur, drink only clear liquids until the nausea and/or vomiting subsides. Call your physician if vomiting continues.  Special Instructions/Symptoms: Your throat may feel dry or sore from the anesthesia or the breathing tube placed in your throat during surgery. If this causes discomfort, gargle with warm salt water. The discomfort should disappear within 24 hours.     

## 2023-05-16 ENCOUNTER — Encounter (HOSPITAL_BASED_OUTPATIENT_CLINIC_OR_DEPARTMENT_OTHER): Payer: Self-pay | Admitting: Urology

## 2023-05-23 NOTE — Progress Notes (Signed)
Patient was a RadOnc Consult on 02/08/23 for his stage T2a adenocarcinoma of the prostate with Gleason score of 3+4, and PSA of 4.77. Patient proceed with treatment recommendations of brachytherapy and had his treatment on 05/14/23.   Patient is scheduled for a post seed CT Simulation on 1/30 and post op urology appointment on 1/21.    RN attempted to call patient to review post treatment education.  No voicemail available.

## 2023-06-05 ENCOUNTER — Telehealth: Payer: Self-pay | Admitting: *Deleted

## 2023-06-05 NOTE — Telephone Encounter (Signed)
Called patient to remind of post seed appts. for 06-06-23, spoke with patient and he is aware of these appts.

## 2023-06-05 NOTE — Progress Notes (Signed)
  Radiation Oncology         [phone]) [phone] ________________________________  Name: Ethan Santana MRN: [id number]  Date: 06/06/2023  DOB: 1951/02/08  COMPLEX SIMULATION NOTE  NARRATIVE:  The patient was brought to the CT Simulation planning suite today following prostate seed implantation approximately one month ago.  Identity was confirmed.  All relevant records and images related to the planned course of therapy were reviewed.  Then, the patient was set-up supine.  CT images were obtained.  The CT images were loaded into the planning software.  Then the prostate and rectum were contoured.  Treatment planning then occurred.  The implanted iodine 125 seeds were identified by the physics staff for projection of radiation distribution  I have requested : 3D Simulation  I have requested a DVH of the following structures: Prostate and rectum.    ________________________________  Artist Pais Kathrynn Running, M.D.

## 2023-06-05 NOTE — Progress Notes (Signed)
Radiation Oncology         [phone]) [phone] ________________________________  Name: Ethan Santana MRN: [id number]  Date: 06/06/2023  DOB: November 23, 1950  Post-Seed Follow-Up Visit Note  CC: Jackelyn Poling, DO  Noel Christmas, MD  Diagnosis:   73 y.o. gentleman with Stage T2a adenocarcinoma of the prostate with Gleason score of 3+4, and PSA of 4.77.     ICD-10-CM   1. Malignant neoplasm of prostate (HCC)  C61       Interval Since Last Radiation:  3 weeks 05/14/23:  Insertion of radioactive I-125 seeds into the prostate gland; 145 Gy, definitive therapy with placement of SpaceOAR gel.  Narrative:  The patient returns today for routine follow-up.  He is complaining of increased urinary frequency and urinary hesitation symptoms. He filled out a questionnaire regarding urinary function today providing and overall IPSS score of 15 characterizing his symptoms as moderate but gradually improving and tolerable without medication.  He had some mild dysuria initially but this has resolved and he specifically denies gross hematuria, straining to void, incontinence, fever or chills. His pre-implant score was 4. He denies any abdominal pain or bowel symptoms. He has remained active with good energy level and overall, is pleased with his progress to date   ALLERGIES:  has no known allergies.  Meds: Current Outpatient Medications  Medication Sig Dispense Refill   aspirin-acetaminophen-caffeine (EXCEDRIN MIGRAINE) 250-250-65 MG tablet Take 1 tablet by mouth daily as needed for headache or migraine.     calcium carbonate (TUMS EX) 750 MG chewable tablet Chew 1-2 tablets by mouth as needed. 1 tab in the morning, 2 tabs at dinner     diclofenac Sodium (VOLTAREN) 1 % GEL Apply topically 4 (four) times daily.     famotidine (PEPCID) 20 MG tablet Take 20 mg by mouth daily.     fluticasone (FLONASE) 50 MCG/ACT nasal spray Place 1 spray into both nostrils daily.      guaiFENesin 200 MG tablet Take 400 mg by  mouth 2 (two) times daily as needed for cough or to loosen phlegm.     metoprolol succinate (TOPROL XL) 25 MG 24 hr tablet Take 1 tablet (25 mg total) by mouth daily. 90 tablet 3   Multiple Vitamins-Minerals (CENTRUM SILVER PO) Take 1 tablet by mouth daily.     omeprazole (PRILOSEC) 20 MG capsule Take 20 mg by mouth daily as needed (indigestion).      pseudoephedrine (SUDAFED) 30 MG tablet Take 30 mg by mouth daily as needed for congestion.     SIMETHICONE PO Take 1 tablet by mouth daily with supper. As needed     sodium chloride (OCEAN) 0.65 % SOLN nasal spray Place 1 spray into both nostrils as needed for congestion.     traMADol (ULTRAM) 50 MG tablet Take 1 tablet (50 mg total) by mouth every 6 (six) hours as needed. 10 tablet 0   No current facility-administered medications for this visit.    Physical Findings: In general this is a well appearing Caucasian male in no acute distress. He's alert and oriented x4 and appropriate throughout the examination. Cardiopulmonary assessment is negative for acute distress and he exhibits normal effort.   Lab Findings: Lab Results  Component Value Date   WBC 5.5 12/29/2018   HGB 17.2 12/29/2018   HCT 48.4 12/29/2018   MCV 87 12/29/2018   PLT 155 12/29/2018    Radiographic Findings:  Patient underwent CT imaging in our clinic for post implant dosimetry. The CT  will be reviewed by Dr. Kathrynn Running to confirm there is an adequate distribution of radioactive seeds throughout the prostate gland and ensure that there are no seeds in or near the rectum.  We suspect the final radiation plan and dosimetry will show appropriate coverage of the prostate gland. He understands that we will call and inform him of any unexpected findings on further review of his imaging and dosimetry.  Impression/Plan: 73 y.o. gentleman with Stage T2a adenocarcinoma of the prostate with Gleason score of 3+4, and PSA of 4.77.  The patient is recovering from the effects of radiation.  His urinary symptoms should gradually improve over the next 4-6 months. We talked about this today. He is encouraged by his improvement already and is otherwise pleased with his outcome. We also talked about long-term follow-up for prostate cancer following seed implant. He understands that ongoing PSA determinations and digital rectal exams will help perform surveillance to rule out disease recurrence. He has a follow up appointment scheduled for labs on 08/20/23 and a visit with Dr. Arita Miss on 08/27/23. He understands what to expect with his PSA measures. Patient was also educated today about some of the long-term effects from radiation including a small risk for rectal bleeding and possibly erectile dysfunction. We talked about some of the general management approaches to these potential complications. However, I did encourage the patient to contact our office or return at any point if he has questions or concerns related to his previous radiation and prostate cancer.    Marguarite Arbour, PA-C

## 2023-06-06 ENCOUNTER — Ambulatory Visit
Admission: RE | Admit: 2023-06-06 | Discharge: 2023-06-06 | Disposition: A | Discharge status: Home / Self Care | Payer: PPO | Source: Ambulatory Visit | Attending: Urology | Admitting: Urology

## 2023-06-06 ENCOUNTER — Ambulatory Visit
Admission: RE | Admit: 2023-06-06 | Discharge: 2023-06-06 | Disposition: A | Discharge status: Home / Self Care | Payer: PPO | Source: Ambulatory Visit | Attending: Radiation Oncology | Admitting: Radiation Oncology

## 2023-06-06 ENCOUNTER — Encounter: Payer: Self-pay | Admitting: Urology

## 2023-06-06 VITALS — BP 134/76 | HR 61 | Temp 97.6°F | Resp 18 | Ht 67.0 in | Wt 139.0 lb

## 2023-06-06 DIAGNOSIS — C61 Malignant neoplasm of prostate: Secondary | ICD-10-CM | POA: Insufficient documentation

## 2023-06-06 NOTE — Progress Notes (Signed)
Post-seed nursing interview for a diagnosis of Stage T2a adenocarcinoma of the prostate with Gleason score of 3+4, and PSA of 4.77.  Patient identity verified x2.   Patient reports mild urinary discomfort 6/10, and moderate ED. Patient doing well otherwise.  Meaningful use complete.  I-PSS score- 15 - Moderate SHIM score- 3- No sexual activity involving the penis within the last 6 months. Urinary Management medication(s) None Urology appointment date- 4/25 with Dr. Arita Miss at [facility]  Vitals- BP 134/76 (BP Location: Right Arm, Patient Position: Sitting, Cuff Size: Normal)   Pulse 61   Temp 97.6 F (36.4 C)   Resp 18   Ht 5\' 7"  (1.702 m)   Wt 139 lb (63 kg)   SpO2 100%   BMI 21.77 kg/m    This concludes the interaction.  Ethan Favors, LPN

## 2023-06-07 ENCOUNTER — Encounter: Payer: Self-pay | Admitting: *Deleted

## 2023-06-11 ENCOUNTER — Encounter: Payer: Self-pay | Admitting: Radiation Oncology

## 2023-06-11 ENCOUNTER — Ambulatory Visit
Admission: RE | Admit: 2023-06-11 | Discharge: 2023-06-11 | Disposition: A | Discharge status: Home / Self Care | Payer: PPO | Source: Ambulatory Visit | Attending: Radiation Oncology | Admitting: Radiation Oncology

## 2023-06-11 DIAGNOSIS — C61 Malignant neoplasm of prostate: Secondary | ICD-10-CM | POA: Diagnosis present

## 2023-06-11 NOTE — Progress Notes (Signed)
  Radiation Oncology         [phone]) [phone] ________________________________  Name: Ethan Santana MRN: [id number]  Date: 06/11/2023  DOB: Jan 09, 1951  3D Planning Note   Prostate Brachytherapy Post-Implant Dosimetry  Diagnosis:  73 y.o. gentleman with Stage T2a adenocarcinoma of the prostate with Gleason score of 3+4, and PSA of 4.77.   Narrative: On a previous date, Ethan Santana returned following prostate seed implantation for post implant planning. He underwent CT scan complex simulation to delineate the three-dimensional structures of the pelvis and demonstrate the radiation distribution.  Since that time, the seed localization, and complex isodose planning with dose volume histograms have now been completed.  Results:   Prostate Coverage - The dose of radiation delivered to the 90% or more of the prostate gland (D90) was 107.09% of the prescription dose. This exceeds our goal of greater than 90%. Rectal Sparing - The volume of rectal tissue receiving the prescription dose or higher was 0.0 cc. This falls under our thresholds tolerance of 1.0 cc.  Impression: The prostate seed implant appears to show adequate target coverage and appropriate rectal sparing.  Plan:  The patient will continue to follow with urology for ongoing PSA determinations. I would anticipate a high likelihood for local tumor control with minimal risk for rectal morbidity.  ________________________________  Ethan Santana, M.D.

## 2023-06-11 NOTE — Radiation Completion Notes (Signed)
 Patient Name: Ethan Santana, Ethan Santana MRN: [id number] Date of Birth: [birth year] Referring Physician: VALLI SHANK, M.D. Date of Service: 2023-06-11 Radiation Oncologist: Adina Barge, M.D. [facility] [place]                             RADIATION ONCOLOGY END OF TREATMENT NOTE     Diagnosis: C61 Malignant neoplasm of prostate Staging on 2023-01-09: Malignant neoplasm of prostate (HCC) T=cT2a, N=cN0, M=cM0 Intent: Curative     ==========DELIVERED PLANS==========  Prostate Seed Implant Date: 2023-05-14   Plan Name: Prostate Seed Implant Site: Prostate Technique: Radioactive Seed Implant I-125 Mode: Brachytherapy Dose Per Fraction: 145 Gy Prescribed Dose (Delivered / Prescribed): 145 Gy / 145 Gy Prescribed Fxs (Delivered / Prescribed): 1 / 1     ==========ON TREATMENT VISIT DATES========== 2023-05-14     ==========UPCOMING VISITS==========

## 2023-06-28 ENCOUNTER — Inpatient Hospital Stay: Payer: PPO | Attending: Adult Health | Admitting: *Deleted

## 2023-06-28 ENCOUNTER — Encounter: Payer: Self-pay | Admitting: *Deleted

## 2023-06-28 DIAGNOSIS — C61 Malignant neoplasm of prostate: Secondary | ICD-10-CM

## 2023-06-28 NOTE — Progress Notes (Signed)
 SCP reviewed and completed. Vaccines UTD. Pt states he had cologuard test. Pt will have post-tx labs in April and f/u at [facility]. Pt is very active in the [facility] in at [facility].

## 2023-07-23 ENCOUNTER — Emergency Department (HOSPITAL_BASED_OUTPATIENT_CLINIC_OR_DEPARTMENT_OTHER)
Admission: EM | Admit: 2023-07-23 | Discharge: 2023-07-23 | Disposition: A | Discharge status: Home / Self Care | Attending: Emergency Medicine | Admitting: Emergency Medicine

## 2023-07-23 ENCOUNTER — Encounter (HOSPITAL_BASED_OUTPATIENT_CLINIC_OR_DEPARTMENT_OTHER): Payer: Self-pay | Admitting: Emergency Medicine

## 2023-07-23 ENCOUNTER — Other Ambulatory Visit: Payer: Self-pay

## 2023-07-23 DIAGNOSIS — J029 Acute pharyngitis, unspecified: Secondary | ICD-10-CM | POA: Diagnosis not present

## 2023-07-23 DIAGNOSIS — Z7982 Long term (current) use of aspirin: Secondary | ICD-10-CM | POA: Insufficient documentation

## 2023-07-23 DIAGNOSIS — I10 Essential (primary) hypertension: Secondary | ICD-10-CM | POA: Insufficient documentation

## 2023-07-23 LAB — GROUP A STREP BY PCR: Group A Strep by PCR: NOT DETECTED

## 2023-07-23 NOTE — ED Triage Notes (Signed)
 Pt c/o sore throat with red spots on tonsils, pain x 3 days

## 2023-07-23 NOTE — Discharge Instructions (Signed)
 Strep test negative.  Follow-up with your primary care doctor if things do not improve.  Recommend symptomatic treatment.  Return for any new or worse symptoms.

## 2023-07-23 NOTE — ED Provider Notes (Signed)
 [place] EMERGENCY DEPARTMENT AT [facility] Provider Note   CSN: [id number] Arrival date & time: 07/23/23  7829     History  Chief Complaint  Patient presents with   Sore Throat    Ethan Santana is a 73 y.o. male.  Patient with a complaint of redness and some spots to the back of his throat had a mild sore throat pain for the past 3 days.  No upper respiratory symptoms.  Patient's had his tonsils removed.  Patient was concerned about strep.  Past Medical History Significant for High Blood Pressure temporal lobe epilepsy.  Patient is never used tobacco products.       Home Medications Prior to Admission medications   Medication Sig Start Date End Date Taking? Authorizing Provider  aspirin-acetaminophen-caffeine (EXCEDRIN MIGRAINE) [phone] MG tablet Take 1 tablet by mouth daily as needed for headache or migraine.    [provider]  calcium carbonate (TUMS EX) 750 MG chewable tablet Chew 1-2 tablets by mouth as needed. 1 tab in the morning, 2 tabs at dinner    [provider]  diclofenac Sodium (VOLTAREN) 1 % GEL Apply topically 4 (four) times daily.    [provider]  famotidine (PEPCID) 20 MG tablet Take 20 mg by mouth daily.    [provider]  fluticasone (FLONASE) 50 MCG/ACT nasal spray Place 1 spray into both nostrils daily.  11/30/12   [provider]  guaiFENesin 200 MG tablet Take 400 mg by mouth 2 (two) times daily as needed for cough or to loosen phlegm.    [provider]  metoprolol succinate (TOPROL XL) 25 MG 24 hr tablet Take 1 tablet (25 mg total) by mouth daily. Patient taking differently: Take 12.5 mg by mouth daily. 10/12/22   Jodelle Red, MD  Multiple Vitamins-Minerals (CENTRUM SILVER PO) Take 1 tablet by mouth daily.    [provider]  omeprazole (PRILOSEC) 20 MG capsule Take 20 mg by mouth daily as needed (indigestion).     [provider]  pseudoephedrine  (SUDAFED) 30 MG tablet Take 30 mg by mouth daily as needed for congestion.    [provider]  SIMETHICONE PO Take 1 tablet by mouth daily with supper. As needed Patient not taking: Reported on 06/28/2023    [provider]  sodium chloride (OCEAN) 0.65 % SOLN nasal spray Place 1 spray into both nostrils as needed for congestion.    [provider]      Allergies    Patient has no known allergies.    Review of Systems   Review of Systems  Constitutional:  Negative for chills and fever.  HENT:  Positive for sore throat. Negative for rhinorrhea and trouble swallowing.   Eyes:  Negative for visual disturbance.  Respiratory:  Negative for cough and shortness of breath.   Cardiovascular:  Negative for chest pain and leg swelling.  Gastrointestinal:  Negative for abdominal pain, diarrhea, nausea and vomiting.  Genitourinary:  Negative for dysuria.  Musculoskeletal:  Negative for back pain and neck pain.  Skin:  Negative for rash.  Neurological:  Negative for dizziness, light-headedness and headaches.  Hematological:  Does not bruise/bleed easily.  Psychiatric/Behavioral:  Negative for confusion.     Physical Exam Updated Vital Signs BP (!) 140/79 (BP Location: Right Arm)   Pulse 64   Temp 98.3 F (36.8 C) (Oral)   Resp 16   Ht 1.702 m (5\' 7" )   Wt 61.2 kg   SpO2 98%  BMI 21.14 kg/m  Physical Exam Vitals and nursing note reviewed.  Constitutional:      General: He is not in acute distress.    Appearance: Normal appearance. He is well-developed. He is not ill-appearing.  HENT:     Head: Normocephalic and atraumatic.     Mouth/Throat:     Mouth: Mucous membranes are moist.     Pharynx: Posterior oropharyngeal erythema present. No oropharyngeal exudate.     Comments: Uvula midline erythema and some red spots to the back of the throat.  No exudate.  No tongue swelling no floor the mouth swelling. Eyes:     Conjunctiva/sclera: Conjunctivae normal.   Cardiovascular:     Rate and Rhythm: Normal rate and regular rhythm.     Heart sounds: No murmur heard. Pulmonary:     Effort: Pulmonary effort is normal. No respiratory distress.     Breath sounds: Normal breath sounds.  Abdominal:     Palpations: Abdomen is soft.     Tenderness: There is no abdominal tenderness.  Musculoskeletal:        General: No swelling.     Cervical back: Neck supple.  Skin:    General: Skin is warm and dry.     Capillary Refill: Capillary refill takes less than 2 seconds.  Neurological:     General: No focal deficit present.     Mental Status: He is alert and oriented to person, place, and time.  Psychiatric:        Mood and Affect: Mood normal.     ED Results / Procedures / Treatments   Labs (all labs ordered are listed, but only abnormal results are displayed) Labs Reviewed  GROUP A STREP BY PCR    EKG None  Radiology No results found.  Procedures Procedures    Medications Ordered in ED Medications - No data to display  ED Course/ Medical Decision Making/ A&P                                 Medical Decision Making  Strep test negative.  Does have some erythema and some spots no ulcers to the posterior pharynx.  Patient with minimal symptoms.  Recommend symptomatic treatment close follow-up with regular doctor to make sure these resolved. Final Clinical Impression(s) / ED Diagnoses Final diagnoses:  Pharyngitis, unspecified etiology    Rx / DC Orders ED Discharge Orders     None         Vanetta Mulders, MD 07/23/23 [phone]

## 2023-07-23 NOTE — ED Notes (Signed)
 Pt is sitting in the chair and would not like to be attached to the monitor right now.

## 2023-07-26 DIAGNOSIS — G40909 Epilepsy, unspecified, not intractable, without status epilepticus: Secondary | ICD-10-CM | POA: Diagnosis not present

## 2023-07-26 DIAGNOSIS — E559 Vitamin D deficiency, unspecified: Secondary | ICD-10-CM | POA: Diagnosis not present

## 2023-07-26 DIAGNOSIS — R972 Elevated prostate specific antigen [PSA]: Secondary | ICD-10-CM | POA: Diagnosis not present

## 2023-07-26 DIAGNOSIS — D696 Thrombocytopenia, unspecified: Secondary | ICD-10-CM | POA: Diagnosis not present

## 2023-07-31 DIAGNOSIS — Z87898 Personal history of other specified conditions: Secondary | ICD-10-CM | POA: Diagnosis not present

## 2023-07-31 DIAGNOSIS — Z0000 Encounter for general adult medical examination without abnormal findings: Secondary | ICD-10-CM | POA: Diagnosis not present

## 2023-07-31 DIAGNOSIS — I10 Essential (primary) hypertension: Secondary | ICD-10-CM | POA: Diagnosis not present

## 2023-07-31 DIAGNOSIS — G629 Polyneuropathy, unspecified: Secondary | ICD-10-CM | POA: Diagnosis not present

## 2023-07-31 DIAGNOSIS — E038 Other specified hypothyroidism: Secondary | ICD-10-CM | POA: Diagnosis not present

## 2023-07-31 DIAGNOSIS — D696 Thrombocytopenia, unspecified: Secondary | ICD-10-CM | POA: Diagnosis not present

## 2023-10-18 DIAGNOSIS — C61 Malignant neoplasm of prostate: Secondary | ICD-10-CM | POA: Diagnosis not present

## 2023-10-18 DIAGNOSIS — N5201 Erectile dysfunction due to arterial insufficiency: Secondary | ICD-10-CM | POA: Diagnosis not present

## 2023-10-18 DIAGNOSIS — R3121 Asymptomatic microscopic hematuria: Secondary | ICD-10-CM | POA: Diagnosis not present

## 2023-10-24 DIAGNOSIS — I10 Essential (primary) hypertension: Secondary | ICD-10-CM | POA: Diagnosis not present

## 2023-11-04 DIAGNOSIS — E038 Other specified hypothyroidism: Secondary | ICD-10-CM | POA: Diagnosis not present

## 2023-11-04 DIAGNOSIS — I10 Essential (primary) hypertension: Secondary | ICD-10-CM | POA: Diagnosis not present

## 2023-11-05 DIAGNOSIS — M25521 Pain in right elbow: Secondary | ICD-10-CM | POA: Diagnosis not present

## 2023-11-22 DIAGNOSIS — I10 Essential (primary) hypertension: Secondary | ICD-10-CM | POA: Diagnosis not present

## 2023-12-05 DIAGNOSIS — I10 Essential (primary) hypertension: Secondary | ICD-10-CM | POA: Diagnosis not present

## 2023-12-05 DIAGNOSIS — E038 Other specified hypothyroidism: Secondary | ICD-10-CM | POA: Diagnosis not present

## 2023-12-27 DIAGNOSIS — H93293 Other abnormal auditory perceptions, bilateral: Secondary | ICD-10-CM | POA: Diagnosis not present

## 2024-01-05 DIAGNOSIS — I10 Essential (primary) hypertension: Secondary | ICD-10-CM | POA: Diagnosis not present

## 2024-01-05 DIAGNOSIS — E038 Other specified hypothyroidism: Secondary | ICD-10-CM | POA: Diagnosis not present

## 2024-02-01 DIAGNOSIS — I10 Essential (primary) hypertension: Secondary | ICD-10-CM | POA: Diagnosis not present

## 2024-02-04 DIAGNOSIS — I10 Essential (primary) hypertension: Secondary | ICD-10-CM | POA: Diagnosis not present

## 2024-02-04 DIAGNOSIS — E038 Other specified hypothyroidism: Secondary | ICD-10-CM | POA: Diagnosis not present

## 2024-02-12 DIAGNOSIS — C61 Malignant neoplasm of prostate: Secondary | ICD-10-CM | POA: Diagnosis not present

## 2024-02-20 ENCOUNTER — Other Ambulatory Visit (HOSPITAL_BASED_OUTPATIENT_CLINIC_OR_DEPARTMENT_OTHER): Payer: Self-pay | Admitting: Cardiology

## 2024-02-20 DIAGNOSIS — R002 Palpitations: Secondary | ICD-10-CM

## 2024-02-20 DIAGNOSIS — I493 Ventricular premature depolarization: Secondary | ICD-10-CM

## 2024-02-20 DIAGNOSIS — I498 Other specified cardiac arrhythmias: Secondary | ICD-10-CM

## 2024-03-02 DIAGNOSIS — I10 Essential (primary) hypertension: Secondary | ICD-10-CM | POA: Diagnosis not present

## 2024-03-05 ENCOUNTER — Other Ambulatory Visit: Payer: Self-pay | Admitting: Medical Genetics

## 2024-03-05 DIAGNOSIS — Z006 Encounter for examination for normal comparison and control in clinical research program: Secondary | ICD-10-CM

## 2024-03-06 DIAGNOSIS — E038 Other specified hypothyroidism: Secondary | ICD-10-CM | POA: Diagnosis not present

## 2024-03-06 DIAGNOSIS — I10 Essential (primary) hypertension: Secondary | ICD-10-CM | POA: Diagnosis not present

## 2024-03-30 LAB — GENECONNECT MOLECULAR SCREEN: Genetic Analysis Overall Interpretation: NEGATIVE

## 2024-04-01 DIAGNOSIS — I10 Essential (primary) hypertension: Secondary | ICD-10-CM | POA: Diagnosis not present

## 2024-04-05 DIAGNOSIS — I10 Essential (primary) hypertension: Secondary | ICD-10-CM | POA: Diagnosis not present

## 2024-04-05 DIAGNOSIS — E038 Other specified hypothyroidism: Secondary | ICD-10-CM | POA: Diagnosis not present

## 2024-04-13 DIAGNOSIS — C61 Malignant neoplasm of prostate: Secondary | ICD-10-CM | POA: Diagnosis not present

## 2024-04-13 DIAGNOSIS — N401 Enlarged prostate with lower urinary tract symptoms: Secondary | ICD-10-CM | POA: Diagnosis not present

## 2024-04-13 DIAGNOSIS — N5203 Combined arterial insufficiency and corporo-venous occlusive erectile dysfunction: Secondary | ICD-10-CM | POA: Diagnosis not present

## 2024-05-05 ENCOUNTER — Other Ambulatory Visit (HOSPITAL_BASED_OUTPATIENT_CLINIC_OR_DEPARTMENT_OTHER): Payer: Self-pay | Admitting: Cardiology

## 2024-05-05 DIAGNOSIS — R002 Palpitations: Secondary | ICD-10-CM

## 2024-05-05 DIAGNOSIS — I498 Other specified cardiac arrhythmias: Secondary | ICD-10-CM

## 2024-05-05 DIAGNOSIS — I493 Ventricular premature depolarization: Secondary | ICD-10-CM

## 2024-06-04 ENCOUNTER — Encounter (HOSPITAL_BASED_OUTPATIENT_CLINIC_OR_DEPARTMENT_OTHER): Payer: Self-pay | Admitting: Cardiology

## 2024-06-08 ENCOUNTER — Encounter (HOSPITAL_BASED_OUTPATIENT_CLINIC_OR_DEPARTMENT_OTHER): Payer: Self-pay | Admitting: Cardiology

## 2024-06-08 ENCOUNTER — Ambulatory Visit (HOSPITAL_BASED_OUTPATIENT_CLINIC_OR_DEPARTMENT_OTHER): Admitting: Cardiology

## 2024-06-08 VITALS — BP 102/72 | HR 77 | Ht 67.0 in | Wt 143.3 lb

## 2024-06-08 DIAGNOSIS — I493 Ventricular premature depolarization: Secondary | ICD-10-CM

## 2024-06-08 DIAGNOSIS — R002 Palpitations: Secondary | ICD-10-CM | POA: Diagnosis not present

## 2024-06-08 DIAGNOSIS — I498 Other specified cardiac arrhythmias: Secondary | ICD-10-CM

## 2024-06-08 DIAGNOSIS — Z8679 Personal history of other diseases of the circulatory system: Secondary | ICD-10-CM | POA: Diagnosis not present

## 2024-06-08 DIAGNOSIS — Z09 Encounter for follow-up examination after completed treatment for conditions other than malignant neoplasm: Secondary | ICD-10-CM | POA: Diagnosis not present

## 2024-06-08 MED ORDER — METOPROLOL SUCCINATE ER 25 MG PO TB24: 25.0000 mg | ORAL_TABLET | Freq: Every day | ORAL | 3 refills | Status: AC

## 2024-06-08 NOTE — Patient Instructions (Signed)

## 2024-06-08 NOTE — Progress Notes (Signed)
 " Cardiology Office Note:  .   Date:  06/08/2024  ID:  Ethan Santana, DOB 1950/06/17, MRN [id number] PCP: Dayna Motto, DO  [facility] Providers Cardiologist:  Shelda Bruckner, MD {  History of Present Illness: .   Ethan Santana is a 74 y.o. male  with a hx of seizure disorder, hypertension, ADD, lumbar spinal stenosis who is seen for follow-up. I initially met him 07/09/2019 as a new consult at the request of Marvene Prentice SAUNDERS, FNP for the evaluation and management of palpitations.   Today: Overall doing well. Able to be active routinely, plays pickleball 3x/week, walks a lot at [facility]. Palpitations have been not bothersome recently. In the past has had brief spells when he went up to a whole pill, which helped. No limitations.   ROS: Denies chest pain, shortness of breath at rest or with normal exertion. No PND, orthopnea, LE edema or unexpected weight gain. No syncope. ROS otherwise negative except as noted.   Studies Reviewed: SABRA    EKG:  EKG Interpretation Date/Time:  Monday June 08 2024 11:01:47 EST Ventricular Rate:  70 PR Interval:  208 QRS Duration:  84 QT Interval:  360 QTC Calculation: 388 R Axis:   65  Text Interpretation: Normal sinus rhythm Normal ECG When compared with ECG of 06-May-2018 08:03, No significant change was found Confirmed by Bruckner Shelda [phone]) on 06/08/2024 11:23:14 AM    Physical Exam:   VS:  BP 102/72   Pulse 77   Ht 5' 7 (1.702 m)   Wt 143 lb 4.8 oz (65 kg)   SpO2 97%   BMI 22.44 kg/m    Wt Readings from Last 3 Encounters:  06/08/24 143 lb 4.8 oz (65 kg)  07/23/23 135 lb (61.2 kg)  06/06/23 139 lb (63 kg)    GEN: Well nourished, well developed in no acute distress HEENT: Normal, moist mucous membranes NECK: No JVD CARDIAC: regular rhythm, normal S1 and S2, no rubs or gallops. No murmur. VASCULAR: Radial and DP pulses 2+ bilaterally. No carotid bruits RESPIRATORY:  Clear to auscultation without rales,  wheezing or rhonchi  ABDOMEN: Soft, non-tender, non-distended MUSCULOSKELETAL:  Ambulates independently SKIN: Warm and dry, no edema NEUROLOGIC:  Alert and oriented x 3. No focal neuro deficits noted. PSYCHIATRIC:  Normal affect    ASSESSMENT AND PLAN: .    Palpitations PVCs, history of ventricular bigeminy  -usually takes metoprolol  succinate 12.5 mg daily (half pill), takes whole pill when more bothersome, prefers to have it ordered as 25 mg daily -discussed red flag warning signs that need immediate medical attention -ECG sinus rhythm -Kardia strips with brief ventricular bigeminy in the past -if symptoms recur/worsen, he will contact me and we will send a monitor   Hypertension, history of: -at goal on no HTN specific meds -on metoprolol  for palpitations/PVCs -he will continue to monitor  CV risk counseling and prevention -recommend heart healthy/Mediterranean diet, with whole grains, fruits, vegetable, fish, lean meats, nuts, and olive oil. Limit salt. -recommend moderate walking, 3-5 times/week for 30-50 minutes each session. Aim for at least 150 minutes/week. Goal should be pace of 3 miles/hours, or walking 1.5 miles in 30 minutes -recommend avoidance of tobacco products. Avoid excess alcohol.  Dispo: 1 year or sooner as needed  Signed, Shelda Bruckner, MD   Shelda Bruckner, MD, PhD, [facility]  [facility]  [place]  [facility] at [facility] at [facility] [address], [address] [place],  [place] [address] [phone]   "
